# Patient Record
Sex: Female | Born: 1990 | Race: White | Hispanic: No | Marital: Married | Smoking: Former smoker
Health system: Southern US, Community
[De-identification: ages and names within clinical notes are randomized; demographics above are authoritative.]

## PROBLEM LIST (undated history)

## (undated) DIAGNOSIS — J45909 Unspecified asthma, uncomplicated: Secondary | ICD-10-CM

## (undated) DIAGNOSIS — F419 Anxiety disorder, unspecified: Secondary | ICD-10-CM

## (undated) DIAGNOSIS — R569 Unspecified convulsions: Secondary | ICD-10-CM

## (undated) DIAGNOSIS — F32A Depression, unspecified: Secondary | ICD-10-CM

## (undated) DIAGNOSIS — K449 Diaphragmatic hernia without obstruction or gangrene: Secondary | ICD-10-CM

## (undated) DIAGNOSIS — E538 Deficiency of other specified B group vitamins: Secondary | ICD-10-CM

## (undated) DIAGNOSIS — G8929 Other chronic pain: Secondary | ICD-10-CM

## (undated) DIAGNOSIS — F39 Unspecified mood [affective] disorder: Secondary | ICD-10-CM

## (undated) DIAGNOSIS — K219 Gastro-esophageal reflux disease without esophagitis: Secondary | ICD-10-CM

## (undated) DIAGNOSIS — K5792 Diverticulitis of intestine, part unspecified, without perforation or abscess without bleeding: Secondary | ICD-10-CM

## (undated) DIAGNOSIS — F329 Major depressive disorder, single episode, unspecified: Secondary | ICD-10-CM

## (undated) DIAGNOSIS — G43909 Migraine, unspecified, not intractable, without status migrainosus: Secondary | ICD-10-CM

## (undated) HISTORY — DX: Unspecified asthma, uncomplicated: J45.909

## (undated) HISTORY — PX: TUBAL LIGATION: SHX77

## (undated) HISTORY — DX: Major depressive disorder, single episode, unspecified: F32.9

## (undated) HISTORY — DX: Anxiety disorder, unspecified: F41.9

## (undated) HISTORY — DX: Depression, unspecified: F32.A

## (undated) HISTORY — DX: Deficiency of other specified B group vitamins: E53.8

## (undated) HISTORY — PX: CHOLECYSTECTOMY: SHX55

## (undated) HISTORY — PX: UPPER GASTROINTESTINAL ENDOSCOPY: SHX188

---
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
## 2012-03-01 ENCOUNTER — Ambulatory Visit: Payer: Self-pay | Admitting: Obstetrics & Gynecology

## 2012-03-04 ENCOUNTER — Observation Stay: Payer: Self-pay | Admitting: Obstetrics and Gynecology

## 2012-03-04 LAB — CBC WITH DIFFERENTIAL/PLATELET
Basophil #: 0.1 x10 3/mm 3
Basophil %: 0.6 %
Eosinophil #: 0.2 x10 3/mm 3
Eosinophil %: 2 %
HCT: 28.9 % — ABNORMAL LOW
HGB: 9.4 g/dL — ABNORMAL LOW
Lymphocyte %: 13.6 %
Lymphs Abs: 1.6 x10 3/mm 3
MCH: 26.4 pg
MCHC: 32.5 g/dL
MCV: 81 fL
Monocyte #: 0.9 "x10 3/mm "
Monocyte %: 7.7 %
Neutrophil #: 9 x10 3/mm 3 — ABNORMAL HIGH
Neutrophil %: 76.1 %
Platelet: 222 x10 3/mm 3
RBC: 3.56 X10 6/mm 3 — ABNORMAL LOW
RDW: 14.1 %
WBC: 11.8 x10 3/mm 3 — ABNORMAL HIGH

## 2012-03-04 LAB — URINALYSIS, COMPLETE
Bacteria: NONE SEEN
Bilirubin,UR: NEGATIVE
Glucose,UR: NEGATIVE mg/dL (ref 0–75)
Leukocyte Esterase: NEGATIVE
Nitrite: NEGATIVE
Protein: NEGATIVE
Specific Gravity: 1.005 (ref 1.003–1.030)
WBC UR: <1 /HPF (ref 0–5)

## 2012-03-04 LAB — BASIC METABOLIC PANEL WITH GFR
Anion Gap: 8
BUN: 3 mg/dL — ABNORMAL LOW
Calcium, Total: 8.4 mg/dL — ABNORMAL LOW
Chloride: 109 mmol/L — ABNORMAL HIGH
Co2: 23 mmol/L
Creatinine: 0.48 mg/dL — ABNORMAL LOW
EGFR (African American): >60
EGFR (Non-African Amer.): >60
Glucose: 78 mg/dL
Osmolality: 275
Potassium: 3.6 mmol/L
Sodium: 140 mmol/L

## 2012-03-13 ENCOUNTER — Ambulatory Visit: Payer: Self-pay | Admitting: Obstetrics & Gynecology

## 2012-03-21 ENCOUNTER — Encounter: Payer: Self-pay | Admitting: Obstetrics and Gynecology

## 2012-03-21 LAB — URINALYSIS, COMPLETE
Bacteria: NONE SEEN
Ketone: NEGATIVE
Nitrite: NEGATIVE
Ph: 7 (ref 4.5–8.0)
Protein: NEGATIVE
RBC,UR: <1 /HPF (ref 0–5)
WBC UR: 2 /HPF (ref 0–5)

## 2012-03-28 ENCOUNTER — Encounter: Payer: Self-pay | Admitting: Maternal and Fetal Medicine

## 2012-03-28 LAB — URINALYSIS, COMPLETE
Bilirubin,UR: NEGATIVE
Blood: NEGATIVE
Glucose,UR: 150 mg/dL (ref 0–75)
Ketone: NEGATIVE
Nitrite: NEGATIVE
Ph: 7 (ref 4.5–8.0)
Protein: NEGATIVE
RBC,UR: 1 /HPF (ref 0–5)
Specific Gravity: 1.009 (ref 1.003–1.030)
Squamous Epithelial: 37
WBC UR: 14 /HPF (ref 0–5)

## 2012-04-04 ENCOUNTER — Encounter: Payer: Self-pay | Admitting: Maternal & Fetal Medicine

## 2012-04-04 LAB — URINALYSIS, COMPLETE
Bilirubin,UR: NEGATIVE
Blood: NEGATIVE
Ketone: NEGATIVE
Ph: 7 (ref 4.5–8.0)
Protein: NEGATIVE
RBC,UR: 2 /HPF (ref 0–5)
Specific Gravity: 1.01 (ref 1.003–1.030)
Squamous Epithelial: 4
WBC UR: 2 /HPF (ref 0–5)

## 2012-04-11 ENCOUNTER — Encounter: Payer: Self-pay | Admitting: Obstetrics and Gynecology

## 2012-04-11 LAB — URINALYSIS, COMPLETE
Bilirubin,UR: NEGATIVE
Blood: NEGATIVE
Glucose,UR: 50 mg/dL (ref 0–75)
Ketone: NEGATIVE
Nitrite: NEGATIVE
Specific Gravity: 1.004 (ref 1.003–1.030)
Squamous Epithelial: 60

## 2012-04-15 ENCOUNTER — Inpatient Hospital Stay: Payer: Self-pay

## 2012-04-15 LAB — CBC WITH DIFFERENTIAL/PLATELET
Basophil #: 0.1 10*3/uL (ref 0.0–0.1)
Basophil %: 0.6 %
Eosinophil #: 0 10*3/uL (ref 0.0–0.7)
HCT: 28.1 % — ABNORMAL LOW (ref 35.0–47.0)
HGB: 9.1 g/dL — ABNORMAL LOW (ref 12.0–16.0)
Lymphocyte #: 1.8 10*3/uL (ref 1.0–3.6)
Lymphocyte %: 13.8 %
MCH: 23.6 pg — ABNORMAL LOW (ref 26.0–34.0)
MCHC: 32.5 g/dL (ref 32.0–36.0)
MCV: 73 fL — ABNORMAL LOW (ref 80–100)
Neutrophil #: 10.3 10*3/uL — ABNORMAL HIGH (ref 1.4–6.5)
RDW: 15.6 % — ABNORMAL HIGH (ref 11.5–14.5)

## 2012-04-17 LAB — HEMATOCRIT: HCT: 23 % — ABNORMAL LOW (ref 35.0–47.0)

## 2012-05-20 DIAGNOSIS — F329 Major depressive disorder, single episode, unspecified: Secondary | ICD-10-CM | POA: Insufficient documentation

## 2013-05-15 ENCOUNTER — Observation Stay: Payer: Self-pay | Admitting: Obstetrics and Gynecology

## 2013-05-19 ENCOUNTER — Inpatient Hospital Stay: Payer: Self-pay

## 2013-05-19 LAB — GC/CHLAMYDIA PROBE AMP

## 2013-05-19 LAB — DRUG SCREEN, URINE
Barbiturates, Ur Screen: NEGATIVE (ref ?–200)
Benzodiazepine, Ur Scrn: NEGATIVE (ref ?–200)
Cocaine Metabolite,Ur ~~LOC~~: NEGATIVE (ref ?–300)
MDMA (Ecstasy)Ur Screen: NEGATIVE (ref ?–500)
Phencyclidine (PCP) Ur S: NEGATIVE (ref ?–25)

## 2013-05-19 LAB — CBC WITH DIFFERENTIAL/PLATELET
Basophil %: 0.3 %
HGB: 9.5 g/dL — ABNORMAL LOW (ref 12.0–16.0)
Lymphocyte #: 1.8 10*3/uL (ref 1.0–3.6)
Lymphocyte %: 14.7 %
MCH: 23.9 pg — ABNORMAL LOW (ref 26.0–34.0)
Monocyte #: 0.9 x10 3/mm (ref 0.2–0.9)
Monocyte %: 7.9 %
WBC: 12 10*3/uL — ABNORMAL HIGH (ref 3.6–11.0)

## 2013-06-15 DIAGNOSIS — K589 Irritable bowel syndrome without diarrhea: Secondary | ICD-10-CM | POA: Insufficient documentation

## 2014-02-19 ENCOUNTER — Ambulatory Visit: Payer: Self-pay | Admitting: Family Medicine

## 2014-02-19 LAB — RAPID STREP-A WITH REFLX: Micro Text Report: NEGATIVE

## 2014-02-23 LAB — BETA STREP CULTURE(ARMC)

## 2014-08-23 DIAGNOSIS — E119 Type 2 diabetes mellitus without complications: Secondary | ICD-10-CM | POA: Insufficient documentation

## 2014-10-30 NOTE — Consult Note (Signed)
Referral Information:   Reason for Referral 24yo G2P1 at 2760w3d (by [id number] [facility] with  GDM presents for follow up.    Referring Physician [facility]    Prenatal Hx Patient had an abnormal glucola of 152 on 8/5 and a subsequent 3hr GTT that was abnormal on 8/8 (one month ago) with values of 96/207/151/132.  She has been to [facility] and has been checking her BS 4x daily.  She was started on glyburide. The dose was increased last weekd to 2.5 mg am/5 mg pm.    Past Obstetrical Hx 2009 NVD of 8#10oz female (different FOB)   Allergies:   Percocet 10/325: Rash  Chocolate: Rash  Other -Explain in Comment: Rash  Vital Signs/Notes:  Nursing Vital Signs: **Vital Signs.:   16-Sep-13 10:53   Vital Signs Type Routine   Temperature Temperature (F) 97.6   Celsius 36.4   Temperature Source oral   Pulse Pulse 114   Pulse source if not from Vital Sign Device dinamap   Systolic BP Systolic BP 149   Diastolic BP (mmHg) Diastolic BP (mmHg) 63   Mean BP 91   BP Source  if not from Vital Sign Device dinamap   Perinatal Consult:   LMP 21-Jul-2011    PMed Hx Rubella Immune, Hx of varicella    Past Medical History cont'd GERD exercise induced asthma    PSurg Hx Laparoscopic cholycystectomy    FHx PGF with diabetes and heart disease    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    Soc Hx Single, smokes 1/2 PPD x 2-3 years, no ETOH since pregnancy, denies drugs   Routine UA:  09-Sep-13 08:10    Color (UA) Yellow   Clarity (UA) Hazy   Glucose (UA) Negative   Bilirubin (UA) Negative   Ketones (UA) Negative   Specific Gravity (UA) 1.009   Blood (UA) Negative   pH (UA) 7.0   Protein (UA) Negative   Nitrite (UA) Negative   Leukocyte Esterase (UA) Negative (Result(s) reported on 21 Mar 2012 at 09:11AM.)   RBC (UA) <1 /HPF   WBC (UA) 2 /HPF   Bacteria (UA) NONE SEEN   Epithelial Cells (UA) 5 /HPF   Mucous (UA) PRESENT   Amorphous  Crystal (UA) PRESENT (Result(s) reported on 21 Mar 2012 at 09:11AM.)  16-Sep-13 10:58    Color (UA) Yellow   Clarity (UA) Cloudy   Glucose (UA) 150 mg/dL   Bilirubin (UA) Negative   Ketones (UA) Negative   Specific Gravity (UA) 1.009   Blood (UA) Negative   pH (UA) 7.0   Protein (UA) Negative   Nitrite (UA) Negative   Leukocyte Esterase (UA) Trace (Result(s) reported on 28 Mar 2012 at 11:20AM.)   RBC (UA) 1 /HPF   WBC (UA) 14 /HPF   Bacteria (UA) 2+   Epithelial Cells (UA) 37 /HPF   Mucous (UA) PRESENT (Result(s) reported on 28 Mar 2012 at 11:20AM.)   Impression/Recommendations:   Impression 24yo G2P1 at 5860w3d with GDM on glyburide, currently with most values out of range.    Recommendations 1.  See Dr. Reuel DerbyEllestad's consult of 03/21/2012 for complete details 2.  Recommend using sugar substitutes in tea and changing to diet soda and walking daily. 3.  Recommend increasing glyburide to 5 mg in the morning and 7.5 mg in the evening  4.  Continue 2x weekly NST/AFI. 5.  Follow up EFW scheduled for 2  weeks in  order to make delivery recommendations (ie amniocentesis for fetal lung maturity at 37 weeks or 39 weeks without amnio). 6.  Follow up 1wk with MFM to monitor BS.   Plan:   Genetic Counseling no    Antepartum Testing Twice weekly     Total Time Spent with Patient 15 minutes    >50% of visit spent in couseling/coordination of care yes    Office Use Only 99242  Level 2 ( ) NEW office consult exp prob focused, 99213  Office Visit Level 3 ( ) EST exp prob focused outpt   Coding Description: MATERNAL CONDITIONS/HISTORY INDICATION(S).   Diabetes - Gestational GDM.  Electronic Signatures: Marcelino Scot (MD)  (Signed 16-Sep-13 11:31)  Authored: Referral, Allergies, Vital Signs/Notes, Consult, Lab, Impression, Plan, Billing, Coding Description   Last Updated: 16-Sep-13 11:31 by Marcelino Scot (MD)

## 2014-10-30 NOTE — Consult Note (Signed)
Referral Information:   Reason for Referral 24yo G2P1 at 5110w3d [facility](EDC 10/25 by [id number] [facility]10/[facility]) with  GDM on glyburide  presents for follow up.    Referring Physician [facility]    Prenatal Hx Patient had an abnormal 3hr GTT  on glyburide  5 mg am/7.5 mg pm with inconsistent adherence to her diet /medication regimen. Ultrasound today reveals a LGA fetus 8lbs 7 oz AFI=19    Past Obstetrical Hx 2009 spontaneous vaginal delivery of 8#10oz female (different FOB)   Home Medications: Medication Instructions Status  Prilosec 40 mg oral delayed release capsule 1 cap(s) orally once a day Active  glyBURIDE 2.5 mg oral tablet 2 tab(s) orally once a day (in the morning) Active  glyBURIDE 7.5 milligram(s) orally once a day (at bedtime) Active   Allergies:   Percocet 10/325: Rash  Chocolate: Rash  Other -Explain in Comment: Rash  Vital Signs/Notes:  Nursing Vital Signs: **Vital Signs.:   30-Sep-13 11:19   Vital Signs Type Routine   Pulse Pulse 105   Pulse source if not from Vital Sign Device dinamap   Respirations Respirations 16   Systolic BP Systolic BP 134   Diastolic BP (mmHg) Diastolic BP (mmHg) 66   Mean BP 88   BP Source  if not from Vital Sign Device dinamap   Perinatal Consult:   LMP 21-Jul-2011    PMed Hx Rubella Immune, Hx of varicella    Past Medical History cont'd GERD exercise induced asthma    PSurg Hx Laparoscopic cholycystectomy    FHx PGF with diabetes and heart disease    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)     Additional Lab/Radiology Notes 9/23-9/30 FBS 55-143 (2/8 >100) 2h pp breakfast 102-176 (4/6 >120) 2h pp lunch 89-201 (4/7 >120) 2 h pp supper 108-220 (4/6 >120)  pt's highest blood sugars after eating at [place] out.   Impression/Recommendations:   Impression 24yo G2P1 at 4910w3d with GDM on glyburide 5 /7.5 with erratic blood sugars between 55 -231 on medication. Over past 2-3 days  having more low/normal blood sugars even when holding her glyburide. It is challenging to sort out her diet (when and what she eats - sometimes skips meal then eats at fast food including sweet tea and soda) and medication adherence (Saturday  I didn't take the glyuride that night to see what would happen).  LGA fetus >97th % EFW with high normal fluid BPP 8/8 on ultrasound I reviewed risk of shoulder dystocia, IUFD, respiratory issues, hypoglycemia and hyperbilirubinemia associated with GDM. I told the pt with EFW >4.5 kg I offer primary cesarean to avoid shoulder dystocia- given her pelvis is proven  to 8lb 10 oz - attempt at spontaneous vaginal delivery seems reasonable.  While her recently decreasing blood sugars on her same regimen are somewhat concerning, NST-AFIs  are generally considered better predictors of placental function/fetal health .  I reviewed risk of Type 2 DM in women who have had GDM and need for postnatal medical follow up.    Recommendations 1- Take medication and  encourage dietary consistency with 3 meals and 3 snacks - check sugar for symptoms of lows contact [facility] and drop glyburide to 2.5/5 if lows persist. 2 -I advised her to report decreased fetal movement to CNM or MD on call 3- Given evidence of "diabetic fetopathy"I discussed Amniocentesis and induction at 37 weeks. I  contacted Wells Guiles CNM - she will discuss with her physician back up regarding delivery planning.   Plan:    Antepartum Testing Twice weekly    Delivery Mode Vaginal    Delivery at what gestational age 42 with amnio for lung maturity     Total Time Spent with Patient 15 minutes    >50% of visit spent in couseling/coordination of care yes    Office Use Only 99213  Office Visit Level 3 ( ) EST exp prob focused outpt   Coding Description: MATERNAL CONDITIONS/HISTORY INDICATION(S).   Diabetes - Gestational GDM.  Electronic Signatures: Rondall Allegra (MD)  (Signed 30-Sep-13 13:43)  Authored: Referral, Home Medications, Allergies, Vital Signs/Notes, Consult, Lab, Lab/Radiology Notes, Impression, Plan, Billing, Coding Description   Last Updated: 30-Sep-13 13:43 by Rondall Allegra (MD)

## 2014-10-30 NOTE — Consult Note (Signed)
Referral Information:   Reason for Referral 24yo G2P1 at [redacted]w[redacted]d (by [redacted]w[redacted]d [place] on 09/10/2011 at [facility]) with  GDM presents for follow up.  She ran out of glyburide, so did not take from 9/16-9/20.    Referring Physician [facility]    Prenatal Hx Patient had an abnormal glucola of 152 on 8/5 and a subsequent 3hr GTT that was abnormal on 8/8 (one month ago) with values of 96/207/151/132.  She has been to [facility] and has been checking her BS 4x daily.  She was started on glyburide. The dose was increased last week to 5 mg am/7.5 mg pm.  As noted, she did not take any glyburide from 9/16-9/20.    Past Obstetrical Hx 2009 NVD of 8#10oz female (different FOB)   Home Medications: Medication Instructions Status  Prilosec 40 mg oral delayed release capsule 1 cap(s) orally once a day Active  glyBURIDE 2.5 mg oral tablet 2 tab(s) orally once a day (in the morning) Active  glyBURIDE 7.5 milligram(s) orally once a day (at bedtime) Active   Allergies:   Percocet 10/325: Rash  Chocolate: Rash  Other -Explain in Comment: Rash  Perinatal Consult:   LMP 21-Jul-2011    PMed Hx Rubella Immune, Hx of varicella    Past Medical History cont'd GERD exercise induced asthma    PSurg Hx Laparoscopic cholycystectomy    FHx PGF with diabetes and heart disease    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    Soc Hx Single, smokes 1/2 PPD x 2-3 years, no ETOH since pregnancy, denies drugs   Routine UA:  09-Sep-13 08:10    Color (UA) Yellow   Clarity (UA) Hazy   Glucose (UA) Negative   Bilirubin (UA) Negative   Ketones (UA) Negative   Specific Gravity (UA) 1.009   Blood (UA) Negative   pH (UA) 7.0   Protein (UA) Negative   Nitrite (UA) Negative   Leukocyte Esterase (UA) Negative (Result(s) reported on 21 Mar 2012 at 09:11AM.)   RBC (UA) <1 /HPF   WBC (UA) 2 /HPF   Bacteria (UA) NONE SEEN   Epithelial Cells (UA) 5 /HPF   Mucous (UA) PRESENT   Amorphous Crystal  (UA) PRESENT (Result(s) reported on 21 Mar 2012 at 09:11AM.)  16-Sep-13 10:58    Color (UA) Yellow   Clarity (UA) Cloudy   Glucose (UA) 150 mg/dL   Bilirubin (UA) Negative   Ketones (UA) Negative   Specific Gravity (UA) 1.009   Blood (UA) Negative   pH (UA) 7.0   Protein (UA) Negative   Nitrite (UA) Negative   Leukocyte Esterase (UA) Trace (Result(s) reported on 28 Mar 2012 at 11:20AM.)   RBC (UA) 1 /HPF   WBC (UA) 14 /HPF   Bacteria (UA) 2+   Epithelial Cells (UA) 37 /HPF   Mucous (UA) PRESENT (Result(s) reported on 28 Mar 2012 at 11:20AM.)  23-Sep-13 08:02    Color (UA) Yellow   Clarity (UA) Cloudy   Glucose (UA) >=500   Bilirubin (UA) Negative   Ketones (UA) Negative   Specific Gravity (UA) 1.010   Blood (UA) Negative   pH (UA) 7.0   Protein (UA) Negative   Nitrite (UA) Negative   Leukocyte Esterase (UA) Negative (Result(s) reported on 04 Apr 2012 at 12:29PM.)   RBC (UA) 2 /HPF   WBC (UA) 2 /HPF   Bacteria (UA) NONE SEEN   Epithelial Cells (UA) 4 /HPF   Mucous (UA) PRESENT  Calcium Oxalate Crystal (UA) PRESENT (Result(s) reported on 04 Apr 2012 at 12:29PM.)     Additional Lab/Radiology Notes 9/16-9/20 (no glyburide) fastings all over 100 (highest 143) and postprandials all out of range 160s-180 9/20-23 fastings all 70s only 5 post meal accuchecks recorded (102-165) 102, 142, 163, 165, 154   Impression/Recommendations:   Impression 24yo G2P1 at 2015w3d with GDM on glyburide, currently with limited accuchecks for the last 3 days but improvement in fasting blood glucose values while taking glyburide.  Limited number of post meal accuchecks available for adjustment.    Recommendations 1.  See Dr. Reuel DerbyEllestad's consult of 03/21/2012 for complete details 2.  She continues to eat fast food.  Agree with Dr. Megan MansBrancazios recomemndations to encourage using sugar substitutes in tea, changing to diet soda and walking daily. 3.  Continue glyburide to 5 mg in the morning and 7.5 mg in  the evening  4.  Continue 2x weekly NST/AFI. 5.  Follow up EFW and MFM consult next  week in order to make delivery recommendations (ie amniocentesis for fetal lung maturity at 37 weeks or 39 weeks without amnio). Patient aware of possible delivery recommendations if fetopathy present.  She reports elevated amniotic fluid volumes and understand this finding is associated with diabetic fetopathy.   Plan:   Genetic Counseling no    Antepartum Testing Twice weekly     Total Time Spent with Patient 15 minutes    >50% of visit spent in couseling/coordination of care yes    Office Use Only 99213  Office Visit Level 3 (15min) EST exp prob focused outpt   Coding Description: MATERNAL CONDITIONS/HISTORY INDICATION(S).  Electronic Signatures: Consuelo PandySmall, Makiyah Zentz (MD)  (Signed 23-Sep-13 13:37)  Authored: Referral, Home Medications, Allergies, Consult, Lab, Lab/Radiology Notes, Impression, Plan, Billing, Coding Description   Last Updated: 23-Sep-13 13:37 by Consuelo PandySmall, Keena Dinse (MD)

## 2014-10-30 NOTE — Consult Note (Signed)
Referral Information:   Reason for Referral 24yo G2P1 at [redacted]w[redacted]d(by [id number] [facility] for [facility] recommendations.    [facility]    Prenatal Hx Patient had an abnormal glucola of 152 on 8/5 and a subsequent 3hr GTT that was abnormal on 8/8 (one month ago) with values of 96/207/151/132.  She has been to [facility] and has been checking her BS 4x daily.  She was recently started on glyburide (current dose 2.31m11mm/2.31mg81m on 8/29) due to persistently elevated fasting blood sugars and 2hr post prandials.  She states that she follows the nutritional guidelines but admits to drinking sweet tea and soda every day.    Past Obstetrical Hx 2009 NVD of 8#10oz female (different FOB)   Home Medications: Medication Instructions Status  glyBURIDE 2.5 mg oral tablet 1 tab(s) orally 2 times a day Active   Allergies:   Percocet 10/325: Rash  Chocolate: Rash  Other -Explain in Comment: Rash  Vital Signs/Notes:  Nursing Vital Signs: **Vital Signs.:   09-Sep-13 09:00   Vital Signs Type Routine   Temperature Temperature (F) 98   Celsius 36.6   Temperature Source oral   Pulse Pulse 103   Pulse source if not from Vital Sign Device dinamap   Respirations Respirations 16   Systolic BP Systolic BP 115 474iastolic BP (mmHg) Diastolic BP (mmHg) 56   Mean BP 75   BP Source  if not from Vital Sign Device dinamap   Perinatal Consult:   LMP 21-Jul-2011    PMed Hx Rubella Immune, Hx of varicella    Past Medical History cont'd GERD exercise induced asthma    PSurg Hx Laparoscopic cholycystectomy    FHx PGF with diabetes and heart disease    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    Soc Hx Single, smokes 1/2 PPD x 2-3 years, no ETOH since pregnancy, denies drugs   Review Of Systems:   Fever/Chills No     Cough No     Abdominal Pain No     Diarrhea No     Constipation No      Nausea/Vomiting No     SOB/DOE No     Chest Pain No     Dysuria No     Tolerating Diet Yes     Medications/Allergies Reviewed Medications/Allergies reviewed      Routine UA:  09-Sep-13 08:10    Color (UA) Yellow   Clarity (UA) Hazy   Glucose (UA) Negative   Bilirubin (UA) Negative   Ketones (UA) Negative   Specific Gravity (UA) 1.009   Blood (UA) Negative   pH (UA) 7.0   Protein (UA) Negative   Nitrite (UA) Negative   Leukocyte Esterase (UA) Negative (Result(s) reported on 21 Mar 2012 at 09:11AM.)   RBC (UA) <1 /HPF   WBC (UA) 2 /HPF   Bacteria (UA) NONE SEEN   Epithelial Cells (UA) 5 /HPF   Mucous (UA) PRESENT   Amorphous Crystal (UA) PRESENT (Result(s) reported on 21 Mar 2012 at 09:11AM.)     Additional Lab/Radiology Notes Since 8/29... FBS 90-132 (10/11 out of range) 2hr PP breakfast 104-179 (7/9 out of range) 2hr PP lunch 118-190 (8/10 out of range) 2hr PP dinner 102-175 (8/9 out of range)   Impression/Recommendations:   Impression 24yo G2P1 at 33w3110w3d GDM on glyburide, currently with most values out of range.  No change  in BS since starting glyburide, likely due to drinking sweet tea and soda.  EFW today 95th% with the [facility] >95th percentile.    Recommendations 1.  Has met with [facility]. 2.  Recommend using sugar substitutes in tea and changing to diet soda and walking daily. 3.  Recommend increasing glyburide to 40m in the pm and monitoring 2hr post prandials using a food diary. 4.  Continue 2x weekly NST/AFI. 5.  Consider f/up EFW in 3-4 weeks in order to make delivery recommendations (ie amniocentesis for fetal lung maturity at 37 weeks or 39 weeks without amnio). 6.  f/up 1wk with MFM to monitor BS and dietary changes.   Plan:   Genetic Counseling no    Antepartum Testing Twice weekly      Total Time Spent with Patient 30 minutes    >50% of visit spent in couseling/coordination of care yes    Office Use Only 99242  Level 2 (347m) NEW office  consult exp prob focused   Coding Description: MATERNAL CONDITIONS/HISTORY INDICATION(S).   Diabetes - Gestational GDM.  Electronic Signatures: ElWynona NeatMD)  (Signed 09-Sep-13 10:52)  Authored: Referral, Home Medications, Allergies, Vital Signs/Notes, Consult, Exam, Lab, Lab/Radiology Notes, Impression, Plan, Billing, Coding Description   Last Updated: 09-Sep-13 10:52 by ElWynona NeatMD)

## 2014-11-20 NOTE — H&P (Signed)
L&D Evaluation:  History Expanded:  HPI 24 yo G3P2002 at 6860w4d gestational age by LMP.  Her pregnancy has been complicated by uncontrolled gestational diabetes on glyburide 10mg  twice daily.  She presents for Induction of labor for the above.  She notes positive fetal movement, no leakage of fluid, and no vaginal bleeding.  Her chart indicates that there is suspected drug abuse by the patient.   Gravida 3   Term 2   PreTerm 0   Abortion 0   Living 2   Blood Type (Maternal) O positive   Group B Strep Results Maternal (Result >5wks must be treated as unknown) negative   Maternal HIV Negative   Maternal Syphilis Ab Nonreactive   Maternal Varicella Immune   Rubella Results (Maternal) immune   Maternal T-Dap Unknown   Patient's Medical History GERD   Patient's Surgical History laparoscopic cholecystectomy   Medications Pre Natal Vitamins  glyburide 10 mg po BID   Allergies NKDA   Social History question of drug abuse   ROS:  ROS All systems were reviewed.  HEENT, CNS, GI, GU, Respiratory, CV, Renal and Musculoskeletal systems were found to be normal.   Exam:  Vital Signs T 98.5, P107, BP 126/72, RR 18   General no apparent distress   Mental Status clear   Chest clear   Heart tachycardic, rhythm regular   Abdomen gravid, non-tender   Estimated Fetal Weight 7 pounds   Fetal Position cephalic   Back no CVAT   Edema no edema   Pelvic no external lesions, 2/50/-2   Mebranes Intact   FHT normal rate with no decels   FHT Description 140/mod var/+accels/no decels   Ucx irregular   Impression:  Impression Induction of labor for uncontrolled gestational diabetes mellitus   Plan:  Comments Foley catheter placed for cervical ripening - get urine drug screen for suspected drug use - check BG every 2 hours and every 1 hour while in labor. - gbs neg - expect vaginal delivery   Labs:  Lab Results: Routine Micro:  07-Nov-14 08:47   Micro Text  Report CHLAM/N.GC RT-PCR ([facility])   CHLAMYDIA                 CHLAMYDIA TRACHOMATIS NEGATIVE   N.GONORRHOEAE             N.GONORRHOEAE NEGATIVE   ANTIBIOTIC                       Routine Hem:  07-Nov-14 08:47   WBC (CBC)  12.0  RBC (CBC) 3.97  Hemoglobin (CBC)  9.5  Hematocrit (CBC)  28.5  Platelet Count (CBC) 278  MCV  72  MCH  23.9  MCHC 33.4  RDW  15.7  Neutrophil % 76.0  Lymphocyte % 14.7  Monocyte % 7.9  Eosinophil % 1.1  Basophil % 0.3  Neutrophil #  9.1  Lymphocyte # 1.8  Monocyte # 0.9  Eosinophil # 0.1  Basophil # 0.0 (Result(s) reported on 19 May 2013 at 09:47AM.)   Electronic Signatures: Conard NovakJackson, Laconda Basich D (MD)  (Signed [phone]-Nov-14 15:13)  Authored: L&D Evaluation, Labs   Last Updated: 07-Nov-14 15:13 by Conard NovakJackson, Shivank Pinedo D (MD)

## 2014-11-20 NOTE — H&P (Signed)
L&D Evaluation:  History:   HPI 24 yo G2P1001 at 5310w2d gestational age by 20 week ultrasound who's pregnancy has been complicated by a new diagnosis of gestational diabetes.  She just recently received her blood glucose testing equipment and noted this morning that she had a 280mg /dL reading. She called the office and was instructed to come to be assessed at L&D.  She notes positive fetal movement, denies VB and leakage of fluid.  She also denies contractions.  She denies fevers, chills, cough, GI and urinary symptoms.    Patient's Medical History GERD    Patient's Surgical History laparoscopic cholecystectomy    Medications Pre [place]atal Vitamins  zantac    Allergies percocet    Social History none    Family History Non-Contributory   ROS:   ROS All systems were reviewed.  HEENT, CNS, GI, GU, Respiratory, CV, Renal and Musculoskeletal systems were found to be normal.   Exam:   Vital Signs stable    Urine Protein negative dipstick    General no apparent distress    Mental Status clear    Chest clear    Heart normal sinus rhythm    Abdomen gravid, non-tender    Estimated Fetal Weight Average for gestational age    Back no CVAT    Edema no edema    Reflexes 2+    Mebranes Intact    FHT normal rate with no decels    FHT Description 135/mod var/+accels/no decels    Ucx absent    Skin no lesions    Other BMP: Gluc=78, Cr=0.48, Anion Gap=8C CBC: WBC=11.8, Hct=28.9, Plt=222 U/A: Gluc=neg, Nitr=neg, LE=neg, Spec grav=1.005, WBC <1/hpf, Epis=<1/hpf, bacteria=none seen.   Impression:   Impression reactive NST, gestational diabetes, no evidence of hyperglycemia   Plan:   Plan UA, EFM/NST    Comments -will discharge patient with instructions to return if BG elevates and remains elevated.  Difficult to explain fasting BG=280 and post-prandial in normal range in her setting.  There is the possibility of error in taking the sample.   No evidence of infection at this  time.   Fetal tracing reassuring given gestational age. She has follow up next Thursday with ultrasound.  Encouraged her to keep her BG log carefully.    Follow Up Appointment already scheduled   Electronic Signatures: Conard NovakJackson, Kuper Rennels D (MD)  (Signed 23-Aug-13 14:51)  Authored: L&D Evaluation   Last Updated: 23-Aug-13 14:51 by Conard NovakJackson, Khaleed Holan D (MD)

## 2014-11-20 NOTE — H&P (Signed)
L&D Evaluation:  History:   HPI 24 yo G2P1001 at 8086w2d gestational age by a 6 week ultrasound.  Her pregnancy has been complicated by uncontrolled gestational diabetes, as well as chlamydia for which she has had a negative test of cure.  For her diabetes she has been taking glyburide 5mg  in the AM and 7.5mg  in the PM.  Her GDM has been managed by [facility] with reports of a large range of values.  She was evaluated by [facility] yesterday (10/3) and it was recommended that she undergo amniocentesis and induction, if fetal lungs mature.  Other concerns for her have been fetal macrosomia and she has been offered primary cesarean section.  She presented today for amniocentesis.  However, she was found to be contracting and dilated to 4cm.  She was, therefore admitted for labor.  She notes positive fetal movement, denies VB and leakage of fluid.  O+, RI, VZI, HBsAg neg, GBS negative on 04/04/12.    Patient's Medical History GERD    Patient's Surgical History Laparoscopic cholecystectomy    Medications Glyburide 5mg  in AM, 7.5mg  in PM.  Protonix 40mg  QAM    Allergies NKDA    Social History Former tobacco user    Family History Non-Contributory   ROS:   ROS All systems were reviewed.  HEENT, CNS, GI, GU, Respiratory, CV, Renal and Musculoskeletal systems were found to be normal., unless noted in hPI.   Exam:   General no apparent distress    Mental Status clear    Chest clear    Heart normal sinus rhythm    Abdomen gravid, tender with contractions    Estimated Fetal Weight Average for gestational age, macrosomic    Back no CVAT    Edema no edema    Pelvic 4/50/hi (ballotable)    Mebranes Intact    FHT normal rate with no decels    Ucx regular   Impression:   Impression active labor, uncontrolled GDM on glyburide, macrosomic fetus   Plan:   Plan EFM/NST, monitor contractions and for cervical change    Comments admit for labor, augment in necessary IVF, clear liquids for  GDM, Q1 BG checks, insulin per unit protocol if BGs elevated. CBC, T&S initially manage expectantly. NOTE: This is a late entry H&P.  She was admitted this morning.   Electronic Signatures: Conard NovakJackson, Justus Duerr D (MD)  (Signed 04-Oct-13 19:13)  Authored: L&D Evaluation   Last Updated: 04-Oct-13 19:13 by Conard NovakJackson, Aylene Acoff D (MD)

## 2015-04-03 ENCOUNTER — Ambulatory Visit: Admission: EM | Admit: 2015-04-03 | Discharge: 2015-04-03 | Discharge status: Absent without leave | Payer: Self-pay

## 2015-11-07 DIAGNOSIS — E538 Deficiency of other specified B group vitamins: Secondary | ICD-10-CM | POA: Insufficient documentation

## 2015-12-03 ENCOUNTER — Ambulatory Visit (HOSPITAL_BASED_OUTPATIENT_CLINIC_OR_DEPARTMENT_OTHER): Payer: Medicaid Other | Admitting: Pain Medicine

## 2015-12-03 ENCOUNTER — Other Ambulatory Visit
Admission: RE | Admit: 2015-12-03 | Discharge: 2015-12-03 | Disposition: A | Discharge status: Home / Self Care | Payer: Medicaid Other | Source: Ambulatory Visit | Attending: Pain Medicine | Admitting: Pain Medicine

## 2015-12-03 ENCOUNTER — Encounter: Payer: Self-pay | Admitting: Pain Medicine

## 2015-12-03 VITALS — BP 128/68 | HR 66 | Temp 98.4°F | Resp 16 | Ht 63.0 in | Wt 185.0 lb

## 2015-12-03 DIAGNOSIS — Z79899 Other long term (current) drug therapy: Secondary | ICD-10-CM | POA: Diagnosis not present

## 2015-12-03 DIAGNOSIS — F329 Major depressive disorder, single episode, unspecified: Secondary | ICD-10-CM

## 2015-12-03 DIAGNOSIS — G4486 Cervicogenic headache: Secondary | ICD-10-CM | POA: Insufficient documentation

## 2015-12-03 DIAGNOSIS — M549 Dorsalgia, unspecified: Secondary | ICD-10-CM

## 2015-12-03 DIAGNOSIS — M25512 Pain in left shoulder: Secondary | ICD-10-CM

## 2015-12-03 DIAGNOSIS — K219 Gastro-esophageal reflux disease without esophagitis: Secondary | ICD-10-CM | POA: Insufficient documentation

## 2015-12-03 DIAGNOSIS — S134XXS Sprain of ligaments of cervical spine, sequela: Secondary | ICD-10-CM

## 2015-12-03 DIAGNOSIS — Z0189 Encounter for other specified special examinations: Secondary | ICD-10-CM

## 2015-12-03 DIAGNOSIS — G8929 Other chronic pain: Secondary | ICD-10-CM | POA: Insufficient documentation

## 2015-12-03 DIAGNOSIS — M545 Low back pain, unspecified: Secondary | ICD-10-CM

## 2015-12-03 DIAGNOSIS — R51 Headache: Secondary | ICD-10-CM

## 2015-12-03 DIAGNOSIS — Z79891 Long term (current) use of opiate analgesic: Secondary | ICD-10-CM | POA: Insufficient documentation

## 2015-12-03 DIAGNOSIS — F419 Anxiety disorder, unspecified: Secondary | ICD-10-CM

## 2015-12-03 DIAGNOSIS — M546 Pain in thoracic spine: Secondary | ICD-10-CM

## 2015-12-03 DIAGNOSIS — M5442 Lumbago with sciatica, left side: Secondary | ICD-10-CM

## 2015-12-03 DIAGNOSIS — M5441 Lumbago with sciatica, right side: Secondary | ICD-10-CM

## 2015-12-03 DIAGNOSIS — M792 Neuralgia and neuritis, unspecified: Secondary | ICD-10-CM | POA: Insufficient documentation

## 2015-12-03 DIAGNOSIS — M791 Myalgia: Secondary | ICD-10-CM

## 2015-12-03 DIAGNOSIS — M5481 Occipital neuralgia: Secondary | ICD-10-CM | POA: Insufficient documentation

## 2015-12-03 DIAGNOSIS — E538 Deficiency of other specified B group vitamins: Secondary | ICD-10-CM

## 2015-12-03 DIAGNOSIS — E119 Type 2 diabetes mellitus without complications: Secondary | ICD-10-CM

## 2015-12-03 DIAGNOSIS — F1190 Opioid use, unspecified, uncomplicated: Secondary | ICD-10-CM | POA: Insufficient documentation

## 2015-12-03 DIAGNOSIS — M25511 Pain in right shoulder: Secondary | ICD-10-CM

## 2015-12-03 DIAGNOSIS — Z5181 Encounter for therapeutic drug level monitoring: Secondary | ICD-10-CM | POA: Insufficient documentation

## 2015-12-03 DIAGNOSIS — M7918 Myalgia, other site: Secondary | ICD-10-CM | POA: Insufficient documentation

## 2015-12-03 DIAGNOSIS — F418 Other specified anxiety disorders: Secondary | ICD-10-CM

## 2015-12-03 DIAGNOSIS — M542 Cervicalgia: Secondary | ICD-10-CM

## 2015-12-03 DIAGNOSIS — F32A Depression, unspecified: Secondary | ICD-10-CM

## 2015-12-03 LAB — COMPREHENSIVE METABOLIC PANEL
ALBUMIN: 4.2 g/dL (ref 3.5–5.0)
ALK PHOS: 57 U/L (ref 38–126)
ALT: 14 U/L (ref 14–54)
AST: 13 U/L — AB (ref 15–41)
Anion gap: 7 (ref 5–15)
BUN: 9 mg/dL (ref 6–20)
CALCIUM: 8.9 mg/dL (ref 8.9–10.3)
CO2: 27 mmol/L (ref 22–32)
CREATININE: 0.68 mg/dL (ref 0.44–1.00)
Chloride: 104 mmol/L (ref 101–111)
GFR calc non Af Amer: >60 mL/min (ref >60)
GLUCOSE: 105 mg/dL — AB (ref 65–99)
Potassium: 3.7 mmol/L (ref 3.5–5.1)
SODIUM: 138 mmol/L (ref 135–145)
Total Bilirubin: 0.8 mg/dL (ref 0.3–1.2)
Total Protein: 7 g/dL (ref 6.5–8.1)

## 2015-12-03 LAB — SEDIMENTATION RATE: Sed Rate: 4 mm/hr (ref 0–20)

## 2015-12-03 LAB — MAGNESIUM: Magnesium: 2 mg/dL (ref 1.7–2.4)

## 2015-12-03 LAB — C-REACTIVE PROTEIN: CRP: 1.1 mg/dL — ABNORMAL HIGH (ref <1.0)

## 2015-12-03 LAB — VITAMIN B12: VITAMIN B 12: 138 pg/mL — AB (ref 180–914)

## 2015-12-03 NOTE — Patient Instructions (Addendum)
Smoking Cessation, Tips for Success If you are ready to quit smoking, congratulations! You have chosen to help yourself be healthier. Cigarettes bring nicotine, tar, carbon monoxide, and other irritants into your body. Your lungs, heart, and blood vessels will be able to work better without these poisons. There are many different ways to quit smoking. Nicotine gum, nicotine patches, a nicotine inhaler, or nicotine nasal spray can help with physical craving. Hypnosis, support groups, and medicines help break the habit of smoking. WHAT THINGS CAN I DO TO MAKE QUITTING EASIER?  Here are some tips to help you quit for good:  Pick a date when you will quit smoking completely. Tell all of your friends and family about your plan to quit on that date.  Do not try to slowly cut down on the number of cigarettes you are smoking. Pick a quit date and quit smoking completely starting on that day.  Throw away all cigarettes.   Clean and remove all ashtrays from your home, work, and car.  On a card, write down your reasons for quitting. Carry the card with you and read it when you get the urge to smoke.  Cleanse your body of nicotine. Drink enough water and fluids to keep your urine clear or pale yellow. Do this after quitting to flush the nicotine from your body.  Learn to predict your moods. Do not let a bad situation be your excuse to have a cigarette. Some situations in your life might tempt you into wanting a cigarette.  Never have "just one" cigarette. It leads to wanting another and another. Remind yourself of your decision to quit.  Change habits associated with smoking. If you smoked while driving or when feeling stressed, try other activities to replace smoking. Stand up when drinking your coffee. Brush your teeth after eating. Sit in a different chair when you read the paper. Avoid alcohol while trying to quit, and try to drink fewer caffeinated beverages. Alcohol and caffeine may urge you to  smoke.  Avoid foods and drinks that can trigger a desire to smoke, such as sugary or spicy foods and alcohol.  Ask people who smoke not to smoke around you.  Have something planned to do right after eating or having a cup of coffee. For example, plan to take a walk or exercise.  Try a relaxation exercise to calm you down and decrease your stress. Remember, you may be tense and nervous for the first 2 weeks after you quit, but this will pass.  Find new activities to keep your hands busy. Play with a pen, coin, or rubber band. Doodle or draw things on paper.  Brush your teeth right after eating. This will help cut down on the craving for the taste of tobacco after meals. You can also try mouthwash.   Use oral substitutes in place of cigarettes. Try using lemon drops, carrots, cinnamon sticks, or chewing gum. Keep them handy so they are available when you have the urge to smoke.  When you have the urge to smoke, try deep breathing.  Designate your home as a nonsmoking area.  If you are a heavy smoker, ask your health care provider about a prescription for nicotine chewing gum. It can ease your withdrawal from nicotine.  Reward yourself. Set aside the cigarette money you save and buy yourself something nice.  Look for support from others. Join a support group or smoking cessation program. Ask someone at home or at work to help you with your plan   to quit smoking.  Always ask yourself, "Do I need this cigarette or is this just a reflex?" Tell yourself, "Today, I choose not to smoke," or "I do not want to smoke." You are reminding yourself of your decision to quit.  Do not replace cigarette smoking with electronic cigarettes (commonly called e-cigarettes). The safety of e-cigarettes is unknown, and some may contain harmful chemicals.  If you relapse, do not give up! Plan ahead and think about what you will do the next time you get the urge to smoke. HOW WILL I FEEL WHEN I QUIT SMOKING? You  may have symptoms of withdrawal because your body is used to nicotine (the addictive substance in cigarettes). You may crave cigarettes, be irritable, feel very hungry, cough often, get headaches, or have difficulty concentrating. The withdrawal symptoms are only temporary. They are strongest when you first quit but will go away within 10-14 days. When withdrawal symptoms occur, stay in control. Think about your reasons for quitting. Remind yourself that these are signs that your body is healing and getting used to being without cigarettes. Remember that withdrawal symptoms are easier to treat than the major diseases that smoking can cause.  Even after the withdrawal is over, expect periodic urges to smoke. However, these cravings are generally short lived and will go away whether you smoke or not. Do not smoke! WHAT RESOURCES ARE AVAILABLE TO HELP ME QUIT SMOKING? Your health care provider can direct you to community resources or hospitals for support, which may include:  Group support.  Education.  Hypnosis.  Therapy.   This information is not intended to replace advice given to you by your health care provider. Make sure you discuss any questions you have with your health care provider.   Document Released: 03/27/2004 Document Revised: 07/20/2014 Document Reviewed: 12/15/2012 [employer] Interactive Patient Education 2016 [employer] Instructed to get [facility] drawn today [facility] the [facility].

## 2015-12-03 NOTE — Progress Notes (Signed)
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 you have with your health care provider.   Document Released: 03/27/2004 Document Revised: 07/20/2014 Document Reviewed: 12/15/2012 Elsevier Interactive Patient Education 2016 [employer]. Instructed to get labwork drawn today in the [facility]

## 2015-12-03 NOTE — Progress Notes (Signed)
Safety precautions to be maintained throughout the outpatient stay will include: orient to surroundings, keep bed in low position, maintain call bell within reach at all times, provide assistance with transfer out of bed and ambulation.  

## 2015-12-04 ENCOUNTER — Ambulatory Visit: Payer: Medicaid Other | Admitting: Pain Medicine

## 2015-12-04 NOTE — Progress Notes (Signed)
Quick Note:   Normal fasting (NPO x 8 hours) glucose levels are between 65-99 mg/dl, with 2 hour fasting, levels are usually less than 140 mg/dl. Any random blood glucose level greater than 200 mg/dl is considered to be Diabetes.  Low levels of AST in the blood are expected and may be normal.  AST levels are often compared with results of other tests such as alkaline phosphatase (ALP), total protein, and bilirubin to help determine which form of liver disease is present.  AST is often measured to monitor treatment of persons with liver disease and may be ordered either by itself or along with other tests for this purpose.  Sometimes AST may be used to monitor people who are taking medications that are potentially toxic to the liver. If AST levels increase, then the person may be switched to another medication.  AST is compared directly to ALT and an AST/ALT ratio is calculated. This ratio may be used to distinguish between different causes of liver damage and to distinguish liver injury from damage to heart or muscle.  ______

## 2015-12-04 NOTE — Progress Notes (Signed)
Quick Note:   Normal Vitamin B-12 levels are between 180 and 914 pg/mL, for our Lab. Patients with levels below 180 pg/ml are considered to have a Vitamin B-12 "deficiency". Older patients with levels between 200 and 500 may be symptomatic, in which case it is considered an "insufficiency". Symptoms of deficiency or insufficiency may include: tingling and numbness of the digits (fingers & toes), generalized muscle weakness, staggering, irritability, confusion, forgetfulness, tenderness, fatigue, shortness of breath, palpitation, anemia, sporadic episodes of diarrhea, decreased immune system, cognitive impairment, and degeneration of the posterior sensory columns of the spinal cord. Deficiency can lead to anemia and congestive heart failure. Lack of vitamin B12 may lead to peripheral neuropathy. The recommended over-the-counter Vitamin B12 dose intake for deficiency is 125 to 2,000 micrograms of cyanocobalamin taken by mouth, daily.  ______ 

## 2015-12-04 NOTE — Progress Notes (Signed)

## 2015-12-06 LAB — 25-HYDROXY VITAMIN D LCMS D2+D3
25-Hydroxy, Vitamin D-2: <1 ng/mL
25-Hydroxy, Vitamin D-3: 34 ng/mL
25-Hydroxy, Vitamin D: 35 ng/mL

## 2015-12-12 ENCOUNTER — Other Ambulatory Visit: Payer: Self-pay

## 2015-12-12 LAB — COMPLIANCE DRUG ANALYSIS, UR

## 2016-01-15 ENCOUNTER — Ambulatory Visit: Payer: Medicaid Other | Attending: Pain Medicine | Admitting: Pain Medicine

## 2016-01-15 ENCOUNTER — Encounter: Payer: Self-pay | Admitting: Pain Medicine

## 2016-01-15 VITALS — BP 111/50 | HR 60 | Temp 98.1°F | Resp 16 | Ht 63.0 in | Wt 190.0 lb

## 2016-01-15 DIAGNOSIS — M25511 Pain in right shoulder: Secondary | ICD-10-CM | POA: Insufficient documentation

## 2016-01-15 DIAGNOSIS — Z87891 Personal history of nicotine dependence: Secondary | ICD-10-CM | POA: Diagnosis not present

## 2016-01-15 DIAGNOSIS — K589 Irritable bowel syndrome without diarrhea: Secondary | ICD-10-CM | POA: Insufficient documentation

## 2016-01-15 DIAGNOSIS — M25512 Pain in left shoulder: Secondary | ICD-10-CM | POA: Diagnosis not present

## 2016-01-15 DIAGNOSIS — Z79891 Long term (current) use of opiate analgesic: Secondary | ICD-10-CM

## 2016-01-15 DIAGNOSIS — M25559 Pain in unspecified hip: Secondary | ICD-10-CM | POA: Diagnosis not present

## 2016-01-15 DIAGNOSIS — F418 Other specified anxiety disorders: Secondary | ICD-10-CM | POA: Insufficient documentation

## 2016-01-15 DIAGNOSIS — M5481 Occipital neuralgia: Secondary | ICD-10-CM | POA: Diagnosis not present

## 2016-01-15 DIAGNOSIS — M792 Neuralgia and neuritis, unspecified: Secondary | ICD-10-CM

## 2016-01-15 DIAGNOSIS — F1190 Opioid use, unspecified, uncomplicated: Secondary | ICD-10-CM

## 2016-01-15 DIAGNOSIS — M25552 Pain in left hip: Secondary | ICD-10-CM | POA: Insufficient documentation

## 2016-01-15 DIAGNOSIS — E538 Deficiency of other specified B group vitamins: Secondary | ICD-10-CM

## 2016-01-15 DIAGNOSIS — R51 Headache: Secondary | ICD-10-CM | POA: Diagnosis not present

## 2016-01-15 DIAGNOSIS — Z5181 Encounter for therapeutic drug level monitoring: Secondary | ICD-10-CM | POA: Diagnosis not present

## 2016-01-15 DIAGNOSIS — Z87828 Personal history of other (healed) physical injury and trauma: Secondary | ICD-10-CM

## 2016-01-15 DIAGNOSIS — M25551 Pain in right hip: Secondary | ICD-10-CM | POA: Insufficient documentation

## 2016-01-15 DIAGNOSIS — M545 Low back pain, unspecified: Secondary | ICD-10-CM

## 2016-01-15 DIAGNOSIS — M5412 Radiculopathy, cervical region: Secondary | ICD-10-CM

## 2016-01-15 DIAGNOSIS — M549 Dorsalgia, unspecified: Secondary | ICD-10-CM

## 2016-01-15 DIAGNOSIS — M542 Cervicalgia: Secondary | ICD-10-CM | POA: Insufficient documentation

## 2016-01-15 DIAGNOSIS — M5416 Radiculopathy, lumbar region: Secondary | ICD-10-CM

## 2016-01-15 DIAGNOSIS — G4486 Cervicogenic headache: Secondary | ICD-10-CM

## 2016-01-15 DIAGNOSIS — M546 Pain in thoracic spine: Secondary | ICD-10-CM | POA: Diagnosis not present

## 2016-01-15 DIAGNOSIS — K219 Gastro-esophageal reflux disease without esophagitis: Secondary | ICD-10-CM | POA: Diagnosis not present

## 2016-01-15 DIAGNOSIS — M533 Sacrococcygeal disorders, not elsewhere classified: Secondary | ICD-10-CM | POA: Insufficient documentation

## 2016-01-15 DIAGNOSIS — M47816 Spondylosis without myelopathy or radiculopathy, lumbar region: Secondary | ICD-10-CM

## 2016-01-15 DIAGNOSIS — G8929 Other chronic pain: Secondary | ICD-10-CM

## 2016-01-15 MED ORDER — GABAPENTIN 300 MG PO CAPS: 300.0000 mg | ORAL_CAPSULE | Freq: Every day | ORAL | Status: DC

## 2016-01-15 MED ORDER — OXYCODONE HCL 5 MG PO TABS: 5.0000 mg | ORAL_TABLET | Freq: Four times a day (QID) | ORAL | Status: DC | PRN

## 2016-01-15 NOTE — Progress Notes (Signed)
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 COMPLETE W/BEND 6+V DG SI JOINTS MR LUMBAR SPINE WO CONTRAST DG HIP UNILAT W OR W/O PELVIS 2-3 VIEWS RIGHT  Interventional Therapies: Scheduled:  None at this time.    Considering:   1. Diagnostic left sided cervical epidural steroid injection under fluoroscopic guidance, with a without sedation. 2. Diagnostic bilateral cervical facet block under fluoroscopic guidance and IV sedation. 3. Possible bilateral cervical facet radiofrequency ablation under fluoroscopic guidance and IV sedation. 4. Diagnostic bilateral greater occipital nerve block under fluoroscopic guidance, with a without sedation.  5. Possible bilateral greater occipital nerve radiofrequency  ablation under fluoroscopic guidance and IV sedation.  6. Diagnostic bilateral intra-articular shoulder joint injection under fluoroscopic guidance, with a without sedation.  7. Diagnostic bilateral suprascapular nerve block under fluoroscopic guidance, with a without  sedation.  8. Possible bilateral suprascapular nerve radiofrequency ablation under fluoroscopic guidance and IV sedation.  9. Diagnostic bilateral lumbar facet block under fluoroscopic guidance and IV sedation.  10. Possible bilateral lumbar facet radiofrequency ablation under fluoroscopic guidance and IV sedation.  11. Diagnostic bilateral sacroiliac joint block under fluoroscopic guidance, with a without sedation.  12. Possible bilateral sacroiliac joint radiofrequency ablation under fluoroscopic guidance and IV sedation.  13. Diagnostic right-sided L4-5 lumbar epidural steroid injection under fluoroscopic guidance, with a without sedation.  14. Diagnostic bilateral intra-articular hip joint injection under fluoroscopic guidance, with a without sedation.  15. Possible bilateral hip joint radiofrequency ablation under fluoroscopic guidance and IV sedation.    PRN Procedures:   1. Diagnostic left sided cervical epidural steroid injection under fluoroscopic guidance, with a without sedation. 2. Diagnostic bilateral cervical facet block under fluoroscopic guidance and IV sedation. 3. Diagnostic bilateral greater occipital nerve block under fluoroscopic guidance, with a without sedation.  4. Diagnostic bilateral intra-articular shoulder joint injection under fluoroscopic guidance, with a without sedation.  5. Diagnostic bilateral suprascapular nerve block under fluoroscopic guidance, with a without sedation.  6. Diagnostic bilateral lumbar facet block under fluoroscopic guidance and IV sedation.  7. Diagnostic bilateral sacroiliac joint block under fluoroscopic guidance, with a without sedation.  8. Diagnostic right-sided L4-5 lumbar epidural steroid injection under fluoroscopic guidance, with a without sedation.  9. Diagnostic bilateral intra-articular hip joint injection under fluoroscopic guidance, with a without sedation.    Referral(s) or Consult(s): None at this time.  New Prescriptions    OXYCODONE (OXY IR/ROXICODONE) 5 MG IMMEDIATE RELEASE TABLET    Take 1 tablet (5 mg total) by mouth every 6 (six) hours as needed for severe pain.    Medications administered during this visit: Allison Dalton had no medications administered during this visit.  Requested PM Follow-up: Return in 3 weeks (on 02/03/2016) for (1-Mo), Med-Mgmt, (PRN) Procedure.  Future Appointments Date Time Provider Department Center  02/11/2016 10:20 AM Delano Metz, MD [facility] None    Primary Care Physician: No primary care provider on file. Location: [facility] Note by: Frona Yost A. Laban Emperor, M.D, DABA, DABAPM, DABPM, DABIPP, FIPP  Pain Score Disclaimer: We use the NRS-11 scale. This is a self-reported, subjective measurement of pain severity with only modest accuracy. It is used primarily to identify changes within a particular patient. It must be understood that outpatient pain scales are significantly less accurate that those used for research, where they can be applied under ideal controlled circumstances with minimal exposure to variables. In reality, the score is likely to be a combination of pain intensity and pain affect, where pain affect describes the degree of emotional arousal or changes in action readiness caused by the sensory experience of pain. Factors such as social and work situation, setting, emotional state, anxiety levels, expectation, and prior pain experience may influence pain perception and show large inter-individual differences that may also be affected by time variables.  Patient instructions provided during this appointment: There are no Patient Instructions on file for this visit.

## 2016-01-15 NOTE — Progress Notes (Signed)
Patient is here for evaluation of upper back and neck pain.  She states she tingling or electrical shocks that run down her arms and legs. Safety precautions to be maintained throughout the outpatient stay will include: orient to surroundings, keep bed in low position, maintain call bell within reach at all times, provide assistance with transfer out of bed and ambulation.

## 2016-01-17 DIAGNOSIS — G8929 Other chronic pain: Secondary | ICD-10-CM | POA: Insufficient documentation

## 2016-01-17 DIAGNOSIS — M25559 Pain in unspecified hip: Secondary | ICD-10-CM

## 2016-01-17 DIAGNOSIS — Z87828 Personal history of other (healed) physical injury and trauma: Secondary | ICD-10-CM | POA: Insufficient documentation

## 2016-02-11 ENCOUNTER — Encounter: Payer: Medicaid Other | Admitting: Pain Medicine

## 2016-02-24 ENCOUNTER — Ambulatory Visit: Payer: Medicaid Other | Attending: Pain Medicine | Admitting: Pain Medicine

## 2016-02-24 ENCOUNTER — Encounter: Payer: Self-pay | Admitting: Pain Medicine

## 2016-02-24 VITALS — BP 118/70 | HR 85 | Temp 97.8°F | Ht 63.0 in | Wt 200.0 lb

## 2016-02-24 DIAGNOSIS — M5412 Radiculopathy, cervical region: Secondary | ICD-10-CM | POA: Diagnosis not present

## 2016-02-24 DIAGNOSIS — M25512 Pain in left shoulder: Secondary | ICD-10-CM | POA: Diagnosis not present

## 2016-02-24 DIAGNOSIS — M47816 Spondylosis without myelopathy or radiculopathy, lumbar region: Secondary | ICD-10-CM

## 2016-02-24 DIAGNOSIS — Z87891 Personal history of nicotine dependence: Secondary | ICD-10-CM | POA: Diagnosis not present

## 2016-02-24 DIAGNOSIS — F1190 Opioid use, unspecified, uncomplicated: Secondary | ICD-10-CM

## 2016-02-24 DIAGNOSIS — R51 Headache: Secondary | ICD-10-CM | POA: Insufficient documentation

## 2016-02-24 DIAGNOSIS — Z79891 Long term (current) use of opiate analgesic: Secondary | ICD-10-CM | POA: Diagnosis not present

## 2016-02-24 DIAGNOSIS — F329 Major depressive disorder, single episode, unspecified: Secondary | ICD-10-CM

## 2016-02-24 DIAGNOSIS — M5481 Occipital neuralgia: Secondary | ICD-10-CM | POA: Diagnosis not present

## 2016-02-24 DIAGNOSIS — M533 Sacrococcygeal disorders, not elsewhere classified: Secondary | ICD-10-CM | POA: Insufficient documentation

## 2016-02-24 DIAGNOSIS — E119 Type 2 diabetes mellitus without complications: Secondary | ICD-10-CM | POA: Diagnosis not present

## 2016-02-24 DIAGNOSIS — F418 Other specified anxiety disorders: Secondary | ICD-10-CM

## 2016-02-24 DIAGNOSIS — G8929 Other chronic pain: Secondary | ICD-10-CM | POA: Insufficient documentation

## 2016-02-24 DIAGNOSIS — Z6835 Body mass index (BMI) 35.0-35.9, adult: Secondary | ICD-10-CM | POA: Insufficient documentation

## 2016-02-24 DIAGNOSIS — K219 Gastro-esophageal reflux disease without esophagitis: Secondary | ICD-10-CM | POA: Diagnosis not present

## 2016-02-24 DIAGNOSIS — E538 Deficiency of other specified B group vitamins: Secondary | ICD-10-CM | POA: Diagnosis not present

## 2016-02-24 DIAGNOSIS — M542 Cervicalgia: Secondary | ICD-10-CM | POA: Insufficient documentation

## 2016-02-24 DIAGNOSIS — F419 Anxiety disorder, unspecified: Secondary | ICD-10-CM | POA: Insufficient documentation

## 2016-02-24 DIAGNOSIS — M545 Low back pain: Secondary | ICD-10-CM | POA: Insufficient documentation

## 2016-02-24 DIAGNOSIS — G4486 Cervicogenic headache: Secondary | ICD-10-CM

## 2016-02-24 DIAGNOSIS — M25511 Pain in right shoulder: Secondary | ICD-10-CM | POA: Insufficient documentation

## 2016-02-24 DIAGNOSIS — M546 Pain in thoracic spine: Secondary | ICD-10-CM | POA: Diagnosis not present

## 2016-02-24 DIAGNOSIS — K589 Irritable bowel syndrome without diarrhea: Secondary | ICD-10-CM | POA: Diagnosis not present

## 2016-02-24 DIAGNOSIS — M5416 Radiculopathy, lumbar region: Secondary | ICD-10-CM | POA: Diagnosis not present

## 2016-02-24 DIAGNOSIS — M792 Neuralgia and neuritis, unspecified: Secondary | ICD-10-CM

## 2016-02-24 DIAGNOSIS — M549 Dorsalgia, unspecified: Secondary | ICD-10-CM

## 2016-02-24 MED ORDER — OXYCODONE HCL 5 MG PO TABS: 5.0000 mg | ORAL_TABLET | Freq: Two times a day (BID) | ORAL | 0 refills | Status: DC | PRN

## 2016-02-24 MED ORDER — OXYCODONE HCL 5 MG PO TABS: 5.0000 mg | ORAL_TABLET | Freq: Four times a day (QID) | ORAL | 0 refills | Status: DC | PRN

## 2016-02-24 MED ORDER — GABAPENTIN 300 MG PO CAPS: 300.0000 mg | ORAL_CAPSULE | Freq: Every day | ORAL | 0 refills | Status: DC

## 2016-02-24 NOTE — Progress Notes (Signed)
Safety precautions to be maintained throughout the outpatient stay will include: orient to surroundings, keep bed in low position, maintain call bell within reach at all times, provide assistance with transfer out of bed and ambulation. Bottle labeled Oxycodone 5 mg # 49/120  Filled 01-15-16

## 2016-02-24 NOTE — Patient Instructions (Signed)
Facet Blocks Patient Information  Description: The facets are joints in the spine between the vertebrae.  Like any joints in the body, facets can become irritated and painful.  Arthritis can also effect the facets.  By injecting steroids and local anesthetic in and around these joints, we can temporarily block the nerve supply to them.  Steroids act directly on irritated nerves and tissues to reduce selling and inflammation which often leads to decreased pain.  Facet blocks may be done anywhere along the spine from the neck to the low back depending upon the location of your pain.   After numbing the skin with local anesthetic (like Novocaine), a small needle is passed onto the facet joints under x-ray guidance.  You may experience a sensation of pressure while this is being done.  The entire block usually lasts about 15-25 minutes.   Conditions which may be treated by facet blocks:   Low back/buttock pain  Neck/shoulder pain  Certain types of headaches  Preparation for the injection:  1. Do not eat any solid food or dairy products within 8 hours of your appointment. 2. You may drink clear liquid up to 3 hours before appointment.  Clear liquids include water, black coffee, juice or soda.  No milk or cream please. 3. You may take your regular medication, including pain medications, with a sip of water before your appointment.  Diabetics should hold regular insulin (if taken separately) and take 1/2 normal NPH dose the morning of the procedure.  Carry some sugar containing items with you to your appointment. 4. A driver must accompany you and be prepared to drive you home after your procedure. 5. Bring all your current medications with you. 6. An IV may be inserted and sedation may be given at the discretion of the physician. 7. A blood pressure cuff, EKG and other monitors will often be applied during the procedure.  Some patients may need to have extra oxygen administered for a short  period. 8. You will be asked to provide medical information, including your allergies and medications, prior to the procedure.  We must know immediately if you are taking blood thinners (like Coumadin/Warfarin) or if you are allergic to IV iodine contrast (dye).  We must know if you could possible be pregnant.  Possible side-effects:   Bleeding from needle site  Infection (rare, may require surgery)  Nerve injury (rare)  Numbness & tingling (temporary)  Difficulty urinating (rare, temporary)  Spinal headache (a headache worse with upright posture)  Light-headedness (temporary)  Pain at injection site (serveral days)  Decreased blood pressure (rare, temporary)  Weakness in arm/leg (temporary)  Pressure sensation in back/neck (temporary)   Call if you experience:   Fever/chills associated with headache or increased back/neck pain  Headache worsened by an upright position  New onset, weakness or numbness of an extremity below the injection site  Hives or difficulty breathing (go to the emergency room)  Inflammation or drainage at the injection site(s)  Severe back/neck pain greater than usual  New symptoms which are concerning to you  Please note:  Although the local anesthetic injected can often make your back or neck feel good for several hours after the injection, the pain will likely return. It takes 3-7 days for steroids to work.  You may not notice any pain relief for at least one week.  If effective, we will often do a series of 2-3 injections spaced 3-6 weeks apart to maximally decrease your pain.  After the initial   series, you may be a candidate for a more permanent nerve block of the facets.  If you have any questions, please call #[phone] [facility]  DO NOT EAT OR DRINK FOR 8 HOURS PRIOR TO PROCEDURE BRING A DRIVER

## 2016-02-24 NOTE — Progress Notes (Signed)
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 physician. 7. A blood pressure cuff, EKG and other monitors will often be applied during the procedure.  Some patients may need to have extra oxygen administered for a short period. 8. You will be asked to provide medical information, including your allergies and medications, prior to the procedure.  We must know immediately if you are taking blood thinners (like Coumadin/Warfarin) or if you are allergic to IV iodine contrast (dye).  We must know if you could possible be pregnant.  Possible side-effects:   Bleeding from needle site  Infection (rare, may require surgery)  Nerve injury (rare)  Numbness & tingling (temporary)  Difficulty urinating (rare, temporary)  Spinal headache (a headache worse with upright posture)  Light-headedness (temporary)  Pain at injection site (serveral days)  Decreased blood pressure  (rare, temporary)  Weakness in arm/leg (temporary)  Pressure sensation in back/neck (temporary)   Call if you experience:   Fever/chills associated with headache or increased back/neck pain  Headache worsened by an upright position  New onset, weakness or numbness of an extremity below the injection site  Hives or difficulty breathing (go to the emergency room)  Inflammation or drainage at the injection site(s)  Severe back/neck pain greater than usual  New symptoms which are concerning to you  Please note:  Although the local anesthetic injected can often make your back or neck feel good for several hours after the injection, the pain will likely return. It takes 3-7 days for steroids to work.  You may not notice any pain relief for at least one week.  If effective, we will often do a series of 2-3 injections spaced 3-6 weeks apart to maximally decrease your pain.  After the initial series, you may be a candidate for a more permanent nerve block of the facets.  If you have any questions, please call #[phone] [facility]  DO NOT EAT OR DRINK FOR 8 HOURS PRIOR TO PROCEDURE BRING A DRIVER

## 2016-03-10 ENCOUNTER — Ambulatory Visit: Payer: Medicaid Other | Attending: Pain Medicine | Admitting: Pain Medicine

## 2016-03-10 ENCOUNTER — Encounter: Payer: Self-pay | Admitting: Pain Medicine

## 2016-03-10 VITALS — BP 99/63 | HR 72 | Temp 98.4°F | Resp 14 | Ht 63.0 in | Wt 206.0 lb

## 2016-03-10 DIAGNOSIS — E119 Type 2 diabetes mellitus without complications: Secondary | ICD-10-CM | POA: Insufficient documentation

## 2016-03-10 DIAGNOSIS — M25511 Pain in right shoulder: Secondary | ICD-10-CM | POA: Diagnosis not present

## 2016-03-10 DIAGNOSIS — M5481 Occipital neuralgia: Secondary | ICD-10-CM | POA: Insufficient documentation

## 2016-03-10 DIAGNOSIS — Z79891 Long term (current) use of opiate analgesic: Secondary | ICD-10-CM | POA: Insufficient documentation

## 2016-03-10 DIAGNOSIS — E538 Deficiency of other specified B group vitamins: Secondary | ICD-10-CM | POA: Diagnosis not present

## 2016-03-10 DIAGNOSIS — K219 Gastro-esophageal reflux disease without esophagitis: Secondary | ICD-10-CM | POA: Insufficient documentation

## 2016-03-10 DIAGNOSIS — R208 Other disturbances of skin sensation: Secondary | ICD-10-CM | POA: Diagnosis not present

## 2016-03-10 DIAGNOSIS — M545 Low back pain, unspecified: Secondary | ICD-10-CM

## 2016-03-10 DIAGNOSIS — M4726 Other spondylosis with radiculopathy, lumbar region: Secondary | ICD-10-CM | POA: Diagnosis not present

## 2016-03-10 DIAGNOSIS — G8929 Other chronic pain: Secondary | ICD-10-CM | POA: Diagnosis not present

## 2016-03-10 DIAGNOSIS — M546 Pain in thoracic spine: Secondary | ICD-10-CM | POA: Diagnosis present

## 2016-03-10 DIAGNOSIS — K529 Noninfective gastroenteritis and colitis, unspecified: Secondary | ICD-10-CM | POA: Insufficient documentation

## 2016-03-10 DIAGNOSIS — M47816 Spondylosis without myelopathy or radiculopathy, lumbar region: Secondary | ICD-10-CM | POA: Diagnosis not present

## 2016-03-10 DIAGNOSIS — M25552 Pain in left hip: Secondary | ICD-10-CM | POA: Insufficient documentation

## 2016-03-10 DIAGNOSIS — F418 Other specified anxiety disorders: Secondary | ICD-10-CM | POA: Insufficient documentation

## 2016-03-10 LAB — TOXASSURE SELECT 13 (MW), URINE: PDF: 0

## 2016-03-10 MED ORDER — FENTANYL CITRATE (PF) 100 MCG/2ML IJ SOLN
25.0000 ug | INTRAMUSCULAR | Status: DC | PRN
  Filled 2016-03-10: qty 2

## 2016-03-10 MED ORDER — LACTATED RINGERS IV SOLN: 1000.0000 mL | Freq: Once | INTRAVENOUS | Status: DC

## 2016-03-10 MED ORDER — MIDAZOLAM HCL 5 MG/5ML IJ SOLN
1.0000 mg | INTRAMUSCULAR | Status: DC | PRN
  Filled 2016-03-10: qty 5

## 2016-03-10 MED ORDER — ROPIVACAINE HCL 2 MG/ML IJ SOLN
9.0000 mL | Freq: Once | INTRAMUSCULAR | Status: AC
  Administered 2016-03-10: 9 mL
  Filled 2016-03-10: qty 10

## 2016-03-10 MED ORDER — LIDOCAINE HCL (PF) 1 % IJ SOLN
10.0000 mL | Freq: Once | INTRAMUSCULAR | Status: AC
  Administered 2016-03-10: 10 mL

## 2016-03-10 MED ORDER — TRIAMCINOLONE ACETONIDE 40 MG/ML IJ SUSP
40.0000 mg | Freq: Once | INTRAMUSCULAR | Status: AC
  Administered 2016-03-10: 40 mg
  Filled 2016-03-10: qty 1
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
## 2016-03-10 NOTE — Progress Notes (Signed)
Safety precautions to be maintained throughout the outpatient stay will include: orient to surroundings, keep bed in low position, maintain call bell within reach at all times, provide assistance with transfer out of bed and ambulation.  

## 2016-03-10 NOTE — Patient Instructions (Signed)
Pain Management Discharge Instructions  General Discharge Instructions :  If you need to reach your doctor call: Monday-Friday 8:00 am - 4:00 pm at [phone] or toll free [phone].  After clinic hours [phone] to have operator reach doctor.  Bring all of your medication bottles to all your appointments in the pain clinic.  To cancel or reschedule your appointment with Pain Management please remember to call 24 hours in advance to avoid a fee.  Refer to the educational materials which you have been given on: General Risks, I had my Procedure. Discharge Instructions, Post Sedation.  Post Procedure Instructions:  The drugs you were given will stay in your system until tomorrow, so for the next 24 hours you should not drive, make any legal decisions or drink any alcoholic beverages.  You may eat anything you prefer, but it is better to start with liquids then soups and crackers, and gradually work up to solid foods.  Please notify your doctor immediately if you have any unusual bleeding, trouble breathing or pain that is not related to your normal pain.  Depending on the type of procedure that was done, some parts of your body may feel week and/or numb.  This usually clears up by tonight or the next day.  Walk with the use of an assistive device or accompanied by an adult for the 24 hours.  You may use ice on the affected area for the first 24 hours.  Put ice in a Ziploc bag and cover with a towel and place against area 15 minutes on 15 minutes off.  You may switch to heat after 24 hours.GENERAL RISKS AND COMPLICATIONS  What are the risk, side effects and possible complications? Generally speaking, most procedures are safe.  However, with any procedure there are risks, side effects, and the possibility of complications.  The risks and complications are dependent upon the sites that are lesioned, or the type of nerve block to be performed.  The closer the procedure is to the spine,  the more serious the risks are.  Great care is taken when placing the radio frequency needles, block needles or lesioning probes, but sometimes complications can occur. 1. Infection: Any time there is an injection through the skin, there is a risk of infection.  This is why sterile conditions are used for these blocks.  There are four possible types of infection. 1. Localized skin infection. 2. Central Nervous System Infection-This can be in the form of Meningitis, which can be deadly. 3. Epidural Infections-This can be in the form of an epidural abscess, which can cause pressure inside of the spine, causing compression of the spinal cord with subsequent paralysis. This would require an emergency surgery to decompress, and there are no guarantees that the patient would recover from the paralysis. 4. Discitis-This is an infection of the intervertebral discs.  It occurs in about 1% of discography procedures.  It is difficult to treat and it may lead to surgery.        2. Pain: the needles have to go through skin and soft tissues, will cause soreness.       3. Damage to internal structures:  The nerves to be lesioned may be near blood vessels or    other nerves which can be potentially damaged.       4. Bleeding: Bleeding is more common if the patient is taking blood thinners such as  aspirin, Coumadin, Ticiid, Plavix, etc., or if he/she have some genetic predisposition  such as   hemophilia. Bleeding into the spinal canal can cause compression of the spinal  cord with subsequent paralysis.  This would require an emergency surgery to  decompress and there are no guarantees that the patient would recover from the  paralysis.       5. Pneumothorax:  Puncturing of a lung is a possibility, every time a needle is introduced in  the area of the chest or upper back.  Pneumothorax refers to free air around the  collapsed lung(s), inside of the thoracic cavity (chest cavity).  Another two possible  complications  related to a similar event would include: Hemothorax and Chylothorax.   These are variations of the Pneumothorax, where instead of air around the collapsed  lung(s), you may have blood or chyle, respectively.       6. Spinal headaches: They may occur with any procedures in the area of the spine.       7. Persistent CSF (Cerebro-Spinal Fluid) leakage: This is a rare problem, but may occur  with prolonged intrathecal or epidural catheters either due to the formation of a fistulous  track or a dural tear.       8. Nerve damage: By working so close to the spinal cord, there is always a possibility of  nerve damage, which could be as serious as a permanent spinal cord injury with  paralysis.       9. Death:  Although rare, severe deadly allergic reactions known as "Anaphylactic  reaction" can occur to any of the medications used.      10. Worsening of the symptoms:  We can always make thing worse.  What are the chances of something like this happening? Chances of any of this occuring are extremely low.  By statistics, you have more of a chance of getting killed in a motor vehicle accident: while driving to the hospital than any of the above occurring .  Nevertheless, you should be aware that they are possibilities.  In general, it is similar to taking a shower.  Everybody knows that you can slip, hit your head and get killed.  Does that mean that you should not shower again?  Nevertheless always keep in mind that statistics do not mean anything if you happen to be on the wrong side of them.  Even if a procedure has a 1 (one) in a 1,000,000 (million) chance of going wrong, it you happen to be that one..Also, keep in mind that by statistics, you have more of a chance of having something go wrong when taking medications.  Who should not have this procedure? If you are on a blood thinning medication (e.g. Coumadin, Plavix, see list of "Blood Thinners"), or if you have an active infection going on, you should not  have the procedure.  If you are taking any blood thinners, please inform your physician.  How should I prepare for this procedure?  Do not eat or drink anything at least six hours prior to the procedure.  Bring a driver with you .  It cannot be a taxi.  Come accompanied by an adult that can drive you back, and that is strong enough to help you if your legs get weak or numb from the local anesthetic.  Take all of your medicines the morning of the procedure with just enough water to swallow them.  If you have diabetes, make sure that you are scheduled to have your procedure done first thing in the morning, whenever possible.  If you have diabetes,   take only half of your insulin dose and notify our nurse that you have done so as soon as you arrive at the clinic.  If you are diabetic, but only take blood sugar pills (oral hypoglycemic), then do not take them on the morning of your procedure.  You may take them after you have had the procedure.  Do not take aspirin or any aspirin-containing medications, at least eleven (11) days prior to the procedure.  They may prolong bleeding.  Wear loose fitting clothing that may be easy to take off and that you would not mind if it got stained with Betadine or blood.  Do not wear any jewelry or perfume  Remove any nail coloring.  It will interfere with some of our monitoring equipment.  NOTE: Remember that this is not meant to be interpreted as a complete list of all possible complications.  Unforeseen problems may occur.  BLOOD THINNERS The following drugs contain aspirin or other products, which can cause increased bleeding during surgery and should not be taken for 2 weeks prior to and 1 week after surgery.  If you should need take something for relief of minor pain, you may take acetaminophen which is found in Tylenol,m Datril, Anacin-3 and Panadol. It is not blood thinner. The products listed below are.  Do not take any of the products listed below  in addition to any listed on your instruction sheet.  A.P.C or A.P.C with Codeine Codeine Phosphate Capsules #3 Ibuprofen Ridaura  ABC compound Congesprin Imuran rimadil  Advil Cope Indocin Robaxisal  Alka-Seltzer Effervescent Pain Reliever and Antacid Coricidin or Coricidin-D  Indomethacin Rufen  Alka-Seltzer plus Cold Medicine Cosprin Ketoprofen S-A-C Tablets  Anacin Analgesic Tablets or Capsules Coumadin Korlgesic Salflex  Anacin Extra Strength Analgesic tablets or capsules CP-2 Tablets Lanoril Salicylate  Anaprox Cuprimine Capsules Levenox Salocol  Anexsia-D Dalteparin Magan Salsalate  Anodynos Darvon compound Magnesium Salicylate Sine-off  Ansaid Dasin Capsules Magsal Sodium Salicylate  Anturane Depen Capsules Marnal Soma  APF Arthritis pain formula Dewitt's Pills Measurin Stanback  Argesic Dia-Gesic Meclofenamic Sulfinpyrazone  Arthritis Bayer Timed Release Aspirin Diclofenac Meclomen Sulindac  Arthritis pain formula Anacin Dicumarol Medipren Supac  Analgesic (Safety coated) Arthralgen Diffunasal Mefanamic Suprofen  Arthritis Strength Bufferin Dihydrocodeine Mepro Compound Suprol  Arthropan liquid Dopirydamole Methcarbomol with Aspirin Synalgos  ASA tablets/Enseals Disalcid Micrainin Tagament  Ascriptin Doan's Midol Talwin  Ascriptin A/D Dolene Mobidin Tanderil  Ascriptin Extra Strength Dolobid Moblgesic Ticlid  Ascriptin with Codeine Doloprin or Doloprin with Codeine Momentum Tolectin  Asperbuf Duoprin Mono-gesic Trendar  Aspergum Duradyne Motrin or Motrin IB Triminicin  Aspirin plain, buffered or enteric coated Durasal Myochrisine Trigesic  Aspirin Suppositories Easprin Nalfon Trillsate  Aspirin with Codeine Ecotrin Regular or Extra Strength Naprosyn Uracel  Atromid-S Efficin Naproxen Ursinus  Auranofin Capsules Elmiron Neocylate Vanquish  Axotal Emagrin Norgesic Verin  Azathioprine Empirin or Empirin with Codeine Normiflo Vitamin E  Azolid Emprazil Nuprin Voltaren  Bayer  Aspirin plain, buffered or children's or timed BC Tablets or powders Encaprin Orgaran Warfarin Sodium  Buff-a-Comp Enoxaparin Orudis Zorpin  Buff-a-Comp with Codeine Equegesic Os-Cal-Gesic   Buffaprin Excedrin plain, buffered or Extra Strength Oxalid   Bufferin Arthritis Strength Feldene Oxphenbutazone   Bufferin plain or Extra Strength Feldene Capsules Oxycodone with Aspirin   Bufferin with Codeine Fenoprofen Fenoprofen Pabalate or Pabalate-SF   Buffets II Flogesic Panagesic   Buffinol plain or Extra Strength Florinal or Florinal with Codeine Panwarfarin   Buf-Tabs Flurbiprofen Penicillamine   Butalbital Compound Four-way cold tablets   Penicillin   Butazolidin Fragmin Pepto-Bismol   Carbenicillin Geminisyn Percodan   Carna Arthritis Reliever Geopen Persantine   Carprofen Gold's salt Persistin   Chloramphenicol Goody's Phenylbutazone   Chloromycetin Haltrain Piroxlcam   Clmetidine heparin Plaquenil   Cllnoril Hyco-pap Ponstel   Clofibrate Hydroxy chloroquine Propoxyphen         Before stopping any of these medications, be sure to consult the physician who ordered them.  Some, such as Coumadin (Warfarin) are ordered to prevent or treat serious conditions such as "deep thrombosis", "pumonary embolisms", and other heart problems.  The amount of time that you may need off of the medication may also vary with the medication and the reason for which you were taking it.  If you are taking any of these medications, please make sure you notify your pain physician before you undergo any procedures.         Facet Joint Block, Care After Refer to this sheet in the next few weeks. These instructions provide you with information on caring for yourself after your procedure. Your health care provider may also give you more specific instructions. Your treatment has been planned according to current medical practices, but problems sometimes occur. Call your health care provider if you have any problems  or questions after your procedure. HOME CARE INSTRUCTIONS   Keep track of the amount of pain relief you feel and how long it lasts.  Limit pain medicine within the first 4-6 hours after the procedure as directed by your health care provider.  Resume taking dietary supplements and medicines as directed by your health care provider.  You may resume your regular diet.  Do not apply heat near or over the injection site(s) for 24 hours.   Do not take a bath or soak in water (such as a pool or lake) for 24 hours.  Do not drive for 24 hours unless approved by your health care provider.  Avoid strenuous activity for 24 hours.  Remove your bandages the morning after the procedure.   If the injection site is tender, applying an ice pack may relieve some tenderness. To do this:  Put ice in a bag.  Place a towel between your skin and the bag.  Leave the ice on for 15-20 minutes, 3-4 times a day.  Keep follow-up appointments as directed by your health care provider. SEEK MEDICAL CARE IF:   Your pain is not controlled by your medicines.   There is drainage from the injection site.   There is significant bleeding or swelling at the injection site.  You have diabetes and your blood sugar is above 180 mg/dL. SEEK IMMEDIATE MEDICAL CARE IF:   You develop a fever of 101F (38.3C) or greater.   You have worsening pain or swelling around the injection site.   You have red streaking around the injection site.   You develop severe pain that is not controlled by your medicines.   You develop a headache, stiff neck, nausea, or vomiting.   Your eyes become very sensitive to light.   You have weakness, paralysis, or tingling in your arms or legs that was not present before the procedure.   You develop difficulty urinating or breathing.    This information is not intended to replace advice given to you by your health care provider. Make sure you discuss any questions you  have with your health care provider.   Document Released: 06/15/2012 Document Revised: 07/20/2014 Document Reviewed: 06/15/2012 Elsevier Interactive Patient Education 2016   [employer] Facet Joint Block The facet joints connect the bones of the spine (vertebrae). They make it possible for you to bend, twist, and make other movements with your spine. They also prevent you from overbending, overtwisting, and making other excessive movements.  A facet joint block is a procedure where a numbing medicine (anesthetic) is injected into a facet joint. Often, a type of anti-inflammatory medicine called a steroid is also injected. A facet joint block may be done for two reasons:   Diagnosis. A facet joint block may be done as a test to see whether neck or back pain is caused by a worn-down or infected facet joint. If the pain gets better after a facet joint block, it means the pain is probably coming from the facet joint. If the pain does not get better, it means the pain is probably not coming from the facet joint.   Therapy. A facet joint block may be done to relieve neck or back pain caused by a facet joint. A facet joint block is only done as a therapy if the pain does not improve with medicine, exercise programs, physical therapy, and other forms of pain management. LET YOUR HEALTH CARE PROVIDER KNOW ABOUT:   Any allergies you have.   All medicines you are taking, including vitamins, herbs, eyedrops, and over-the-counter medicines and creams.   Previous problems you or members of your family have had with the use of anesthetics.   Any blood disorders you have had.   Other health problems you have. RISKS AND COMPLICATIONS Generally, having a facet joint block is safe. However, as with any procedure, complications can occur. Possible complications associated with having a facet joint block include:   Bleeding.   Injury to a nerve near the injection site.   Pain at the injection site.    Weakness or numbness in areas controlled by nerves near the injection site.   Infection.   Temporary fluid retention.   Allergic reaction to anesthetics or medicines used during the procedure. BEFORE THE PROCEDURE   Follow your health care provider's instructions if you are taking dietary supplements or medicines. You may need to stop taking them or reduce your dosage.   Do not take any new dietary supplements or medicines without asking your health care provider first.   Follow your health care provider's instructions about eating and drinking before the procedure. You may need to stop eating and drinking several hours before the procedure.   Arrange to have an adult drive you home after the procedure. PROCEDURE  You may need to remove your clothing and dress in an open-back gown so that your health care provider can access your spine.   The procedure will be done while you are lying on an X-ray table. Most of the time you will be asked to lie on your stomach, but you may be asked to lie in a different position if an injection will be made in your neck.   Special machines will be used to monitor your oxygen levels, heart rate, and blood pressure.   If an injection will be made in your neck, an intravenous (IV) tube will be inserted into one of your veins. Fluids and medicine will flow directly into your body through the IV tube.   The area over the facet joint where the injection will be made will be cleaned with an antiseptic soap. The surrounding skin will be covered with sterile drapes.   An anesthetic will be applied to   your skin to make the injection area numb. You may feel a temporary stinging or burning sensation.   A video X-ray machine will be used to locate the joint. A contrast dye may be injected into the facet joint area to help with locating the joint.   When the joint is located, an anesthetic medicine will be injected into the joint through the  needle.   Your health care provider will ask you whether you feel pain relief. If you do feel relief, a steroid may be injected to provide pain relief for a longer period of time. If you do not feel relief or feel only partial relief, additional injections of an anesthetic may be made in other facet joints.   The needle will be removed, the skin will be cleansed, and bandages will be applied.  AFTER THE PROCEDURE   You will be observed for 15-30 minutes before being allowed to go home. Do not drive. Have an adult drive you or take a taxi or public transportation instead.   If you feel pain relief, the pain will return in several hours or days when the anesthetic wears off.   You may feel pain relief 2-14 days after the procedure. The amount of time this relief lasts varies from person to person.   It is normal to feel some tenderness over the injected area(s) for 2 days following the procedure.   If you have diabetes, you may have a temporary increase in blood sugar.   This information is not intended to replace advice given to you by your health care provider. Make sure you discuss any questions you have with your health care provider.   Document Released: 11/18/2006 Document Revised: 07/20/2014 Document Reviewed: 04/18/2012 Elsevier Interactive Patient Education 2016 [employer]  

## 2016-03-10 NOTE — Progress Notes (Signed)
1358 Versed 1 mg IV, Fentanyl 50 mcg IV 1409 Versed 2 mg IV, Fentanyl 50 mcg IV 1417 Versed 2 mg IV

## 2016-03-11 ENCOUNTER — Telehealth: Payer: Self-pay | Admitting: *Deleted

## 2016-03-11 NOTE — Telephone Encounter (Signed)
Left voice mail

## 2016-03-23 ENCOUNTER — Encounter: Payer: Medicaid Other | Admitting: Pain Medicine

## 2016-05-18 ENCOUNTER — Encounter: Payer: Self-pay | Admitting: Medical Oncology

## 2016-05-18 ENCOUNTER — Emergency Department
Admission: EM | Admit: 2016-05-18 | Discharge: 2016-05-18 | Disposition: A | Discharge status: Home / Self Care | Payer: Medicaid Other | Attending: Emergency Medicine | Admitting: Emergency Medicine

## 2016-05-18 DIAGNOSIS — Z87891 Personal history of nicotine dependence: Secondary | ICD-10-CM | POA: Diagnosis not present

## 2016-05-18 DIAGNOSIS — R590 Localized enlarged lymph nodes: Secondary | ICD-10-CM | POA: Diagnosis not present

## 2016-05-18 DIAGNOSIS — J45909 Unspecified asthma, uncomplicated: Secondary | ICD-10-CM | POA: Insufficient documentation

## 2016-05-18 DIAGNOSIS — L02411 Cutaneous abscess of right axilla: Secondary | ICD-10-CM | POA: Diagnosis present

## 2016-05-18 MED ORDER — CEPHALEXIN 500 MG PO CAPS: 500.0000 mg | ORAL_CAPSULE | Freq: Four times a day (QID) | ORAL | 0 refills | Status: DC
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
## 2016-05-18 NOTE — ED Triage Notes (Signed)
Pt reports abscess under rt arm x 2 weeks.

## 2016-05-18 NOTE — Discharge Instructions (Signed)
Advised extra strength Tylenol for pain.

## 2016-05-18 NOTE — ED Notes (Signed)
See triage note  States she developed a swollen area under right arm about 2 weeks ago    States increased pain  No redness noted

## 2016-10-27 ENCOUNTER — Telehealth: Payer: Self-pay

## 2016-10-27 ENCOUNTER — Emergency Department
Admission: EM | Admit: 2016-10-27 | Discharge: 2016-10-27 | Disposition: A | Discharge status: Home / Self Care | Payer: Medicaid Other | Attending: Student in an Organized Health Care Education/Training Program | Admitting: Student in an Organized Health Care Education/Training Program

## 2016-10-27 DIAGNOSIS — Z87891 Personal history of nicotine dependence: Secondary | ICD-10-CM | POA: Diagnosis not present

## 2016-10-27 DIAGNOSIS — J069 Acute upper respiratory infection, unspecified: Secondary | ICD-10-CM | POA: Diagnosis not present

## 2016-10-27 DIAGNOSIS — Z79899 Other long term (current) drug therapy: Secondary | ICD-10-CM | POA: Diagnosis not present

## 2016-10-27 DIAGNOSIS — R05 Cough: Secondary | ICD-10-CM | POA: Diagnosis present

## 2016-10-27 DIAGNOSIS — J45909 Unspecified asthma, uncomplicated: Secondary | ICD-10-CM | POA: Diagnosis not present

## 2016-10-27 DIAGNOSIS — B9789 Other viral agents as the cause of diseases classified elsewhere: Secondary | ICD-10-CM

## 2016-10-27 DIAGNOSIS — E119 Type 2 diabetes mellitus without complications: Secondary | ICD-10-CM | POA: Insufficient documentation

## 2016-10-27 LAB — URINALYSIS, COMPLETE (UACMP) WITH MICROSCOPIC
BACTERIA UA: NONE SEEN
BILIRUBIN URINE: NEGATIVE
Glucose, UA: NEGATIVE mg/dL
HGB URINE DIPSTICK: NEGATIVE
KETONES UR: NEGATIVE mg/dL
Leukocytes, UA: NEGATIVE
NITRITE: NEGATIVE
PROTEIN: 30 mg/dL — AB
Specific Gravity, Urine: 1.024 (ref 1.005–1.030)
pH: 5 (ref 5.0–8.0)

## 2016-10-27 LAB — POCT RAPID STREP A: Streptococcus, Group A Screen (Direct): NEGATIVE

## 2016-10-27 MED ORDER — FEXOFENADINE-PSEUDOEPHED ER 60-120 MG PO TB12: 1.0000 | ORAL_TABLET | Freq: Two times a day (BID) | ORAL | 0 refills | Status: DC

## 2016-10-27 MED ORDER — MAGIC MOUTHWASH W/LIDOCAINE: 5.0000 mL | Freq: Four times a day (QID) | ORAL | 0 refills | Status: DC

## 2016-10-27 MED ORDER — IBUPROFEN 600 MG PO TABS
600.0000 mg | ORAL_TABLET | Freq: Three times a day (TID) | ORAL | 0 refills | Status: DC | PRN
Start: 2016-10-27 — End: 2017-03-02
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
## 2016-10-27 NOTE — Telephone Encounter (Signed)
Pt has not been here since September pt says she stopped coming because a death in her family and she started to get really depressed pt would like to come back as a patient to see Dr Laban Emperor. Please tell me if I can schedule this patient for a follow up

## 2016-10-27 NOTE — ED Triage Notes (Signed)
Pt c/o sore throat with cough and sinus congestion for the past 3 days

## 2016-10-27 NOTE — ED Notes (Signed)
Throat red, sore. Pt a/o

## 2016-10-28 DIAGNOSIS — G43009 Migraine without aura, not intractable, without status migrainosus: Secondary | ICD-10-CM | POA: Insufficient documentation

## 2016-10-28 NOTE — Telephone Encounter (Signed)
Patient had a procedure the last time she was here.  I do not see any documented reason why she could not come back.  Schedule her for evaluation since it has been less than a year.

## 2016-11-19 ENCOUNTER — Ambulatory Visit: Payer: Medicaid Other | Attending: Pain Medicine | Admitting: Pain Medicine

## 2016-11-19 ENCOUNTER — Encounter: Payer: Self-pay | Admitting: Pain Medicine

## 2016-11-19 VITALS — BP 121/67 | HR 93 | Temp 98.9°F | Resp 18 | Ht 63.0 in | Wt 211.0 lb

## 2016-11-19 DIAGNOSIS — F419 Anxiety disorder, unspecified: Secondary | ICD-10-CM | POA: Diagnosis not present

## 2016-11-19 DIAGNOSIS — M25511 Pain in right shoulder: Secondary | ICD-10-CM | POA: Diagnosis not present

## 2016-11-19 DIAGNOSIS — G894 Chronic pain syndrome: Secondary | ICD-10-CM | POA: Diagnosis not present

## 2016-11-19 DIAGNOSIS — Z79891 Long term (current) use of opiate analgesic: Secondary | ICD-10-CM

## 2016-11-19 DIAGNOSIS — M542 Cervicalgia: Secondary | ICD-10-CM

## 2016-11-19 DIAGNOSIS — M25512 Pain in left shoulder: Secondary | ICD-10-CM | POA: Diagnosis not present

## 2016-11-19 DIAGNOSIS — F1190 Opioid use, unspecified, uncomplicated: Secondary | ICD-10-CM | POA: Diagnosis not present

## 2016-11-19 DIAGNOSIS — M4696 Unspecified inflammatory spondylopathy, lumbar region: Secondary | ICD-10-CM | POA: Diagnosis not present

## 2016-11-19 DIAGNOSIS — M5442 Lumbago with sciatica, left side: Secondary | ICD-10-CM | POA: Diagnosis not present

## 2016-11-19 DIAGNOSIS — M549 Dorsalgia, unspecified: Secondary | ICD-10-CM

## 2016-11-19 DIAGNOSIS — M5441 Lumbago with sciatica, right side: Secondary | ICD-10-CM | POA: Diagnosis not present

## 2016-11-19 DIAGNOSIS — S199XXS Unspecified injury of neck, sequela: Secondary | ICD-10-CM | POA: Insufficient documentation

## 2016-11-19 DIAGNOSIS — M7918 Myalgia, other site: Secondary | ICD-10-CM

## 2016-11-19 DIAGNOSIS — M545 Low back pain: Secondary | ICD-10-CM | POA: Insufficient documentation

## 2016-11-19 DIAGNOSIS — F329 Major depressive disorder, single episode, unspecified: Secondary | ICD-10-CM | POA: Insufficient documentation

## 2016-11-19 DIAGNOSIS — M546 Pain in thoracic spine: Secondary | ICD-10-CM | POA: Diagnosis present

## 2016-11-19 DIAGNOSIS — E119 Type 2 diabetes mellitus without complications: Secondary | ICD-10-CM | POA: Diagnosis not present

## 2016-11-19 DIAGNOSIS — G8929 Other chronic pain: Secondary | ICD-10-CM

## 2016-11-19 DIAGNOSIS — K219 Gastro-esophageal reflux disease without esophagitis: Secondary | ICD-10-CM | POA: Diagnosis not present

## 2016-11-19 DIAGNOSIS — M47816 Spondylosis without myelopathy or radiculopathy, lumbar region: Secondary | ICD-10-CM

## 2016-11-19 DIAGNOSIS — M791 Myalgia: Secondary | ICD-10-CM | POA: Diagnosis not present

## 2016-11-19 DIAGNOSIS — E538 Deficiency of other specified B group vitamins: Secondary | ICD-10-CM | POA: Insufficient documentation

## 2016-11-19 MED ORDER — CYCLOBENZAPRINE HCL 10 MG PO TABS: 10.0000 mg | ORAL_TABLET | Freq: Three times a day (TID) | ORAL | 0 refills | Status: DC | PRN

## 2016-11-19 NOTE — Progress Notes (Signed)
Safety precautions to be maintained throughout the outpatient stay will include: orient to surroundings, keep bed in low position, maintain call bell within reach at all times, provide assistance with transfer out of bed and ambulation.  

## 2016-11-19 NOTE — Patient Instructions (Addendum)
Naltrexone tablets What is this medicine? NALTREXONE (nal TREX one) helps you to remain free of your dependence on opiate drugs or alcohol. It blocks the 'high' that these substances can give you. This medicine is combined with counseling and support groups. This medicine may be used for other purposes; ask your health care provider or pharmacist if you have questions. COMMON BRAND NAME(S): Depade, ReVia What should I tell my health care provider before I take this medicine? They need to know if you have any of these conditions: -if you have used drugs or alcohol within 7 to 10 days -kidney disease -liver disease, including hepatitis -an unusual or allergic reaction to naltrexone, other medicines, foods, dyes, or preservatives -pregnant or trying to get pregnant -breast-feeding How should I use this medicine? Take this medicine by mouth with a full glass of water. Follow the directions on the prescription label. Do not take this medicine within 7 to 10 days of taking any opioid drugs. Take your medicine at regular intervals. Do not take your medicine more often than directed. Do not stop taking except on your doctor's advice. Talk to your pediatrician regarding the use of this medicine in children. Special care may be needed. Overdosage: If you think you have taken too much of this medicine contact a poison control center or emergency room at once. NOTE: This medicine is only for you. Do not share this medicine with others. What if I miss a dose? If you miss a dose and remember on the same day, take the missed dose. If you do not remember until the next day, ask your doctor or health care professional about rescheduling your doses. Do not take double or extra doses. What may interact with this medicine? Do not take this medicine with any of the following medications: -any prescription or street opioid drug like codiene, heroin, methadone This medicine may also interact with the following  medications: -disulfiram -thioridazine This list may not describe all possible interactions. Give your health care provider a list of all the medicines, herbs, non-prescription drugs, or dietary supplements you use. Also tell them if you smoke, drink alcohol, or use illegal drugs. Some items may interact with your medicine. What should I watch for while using this medicine? Your condition will be monitored carefully while you are receiving this medicine. Visit your doctor or health care professional regularly. For this medicine to be most effective you should attend any counseling or support groups that your doctor or health care professional recommends. Do not try to overcome the effects of the medicine by taking large amounts of narcotics or by drinking large amounts of alcohol. This can cause severe problems including death. Also, you may be more sensitive to lower doses of narcotics after you stop taking this medicine. If you are going to have surgery, tell your doctor or health care professional that you are taking this medicine. Do not treat yourself for coughs, colds, pain, or diarrhea. Ask your doctor or health care professional for advice. Some of the ingredients may interact with this medicine and cause side effects. Wear a medical ID bracelet or chain, and carry a card that describes your disease and details of your medicine and dosage times. You may get drowsy or dizzy. Do not drive, use machinery, or do anything that needs mental alertness until you know how this medicine affects you. Do not stand or sit up quickly, especially if you are an older patient. This reduces the risk of dizzy or fainting spells.   Alcohol may interfere with the effect of this medicine. Avoid alcoholic drinks. What side effects may I notice from receiving this medicine? Side effects that you should report to your doctor or health care professional as soon as possible: -allergic reactions like skin rash, itching or  hives, swelling of the face, lips, or tongue -breathing problems -changes in vision, hearing -confusion -dark urine -depressed mood -diarrhea -fast or irregular heart beat -hallucination, loss of contact with reality -light-colored stools -right upper belly pain -suicidal thoughts or other mood changes -unusually weak or tired -vomiting -yellowing of the eyes or skin Side effects that usually do not require medical attention (report to your doctor or health care professional if they continue or are bothersome): -aches, pains -change in sex drive or performance -feeling anxious -headache -loss of appetite, nausea -runny nose, sinus problems, sneezing -stomach pain -trouble sleeping This list may not describe all possible side effects. Call your doctor for medical advice about side effects. You may report side effects to FDA at 1-800-FDA-1088. Where should I keep my medicine? Keep out of the reach of children. Store at room temperature between 20 and 25 degrees C (68 and 77 degrees F). Throw away any unused medicine after the expiration date. NOTE: This sheet is a summary. It may not cover all possible information. If you have questions about this medicine, talk to your doctor, pharmacist, or health care provider.  2018 Elsevier/Gold Standard (2012-04-21 10:33:18) _______________________________________________________________  Preparing for Procedure with Sedation Instructions: . Oral Intake: Do not eat or drink anything for at least 8 hours prior to your procedure. . Transportation: Public transportation is not allowed. Bring an adult driver. The driver must be physically present in our waiting room before any procedure can be started. Physical Assistance: Bring an adult physically capable of assisting you, in the  Facet Joint Block The facet joints connect the bones of the spine (vertebrae). They make it possible for you to bend, twist, and make other movements with your  spine. They also keep you from bending too far, twisting too far, and making other excessive movements. A facet joint block is a procedure where a numbing medicine (anesthetic) is injected into a facet joint. Often, a type of anti-inflammatory medicine called a steroid is also injected. A facet joint block may be done to diagnose neck or back pain. If the pain gets better after a facet joint block, it means the pain is probably coming from the facet joint. If the pain does not get better, it means the pain is probably not coming from the facet joint. A facet joint block may also be done to relieve neck or back pain caused by an inflamed facet joint. A facet joint block is only done to relieve pain if the pain does not improve with other methods, such as medicine, exercise programs, and physical therapy. Tell a health care provider about:  Any allergies you have.  All medicines you are taking, including vitamins, herbs, eye drops, creams, and over-the-counter medicines.  Any problems you or family members have had with anesthetic medicines.  Any blood disorders you have.  Any surgeries you have had.  Any medical conditions you have.  Whether you are pregnant or may be pregnant. What are the risks? Generally, this is a safe procedure. However, problems may occur, including:  Bleeding.  Injury to a nerve near the injection site.  Pain at the injection site.  Weakness or numbness in areas controlled by nerves near the injection site.  Infection.  Temporary fluid retention.  Allergic reactions to medicines or dyes.  Injury to other structures or organs near the injection site. What happens before the procedure?  Follow instructions from your health care provider about eating or drinking restrictions.  Ask your health care provider about:  Changing or stopping your regular medicines. This is especially important if you are taking diabetes medicines or blood thinners.  Taking  medicines such as aspirin and ibuprofen. These medicines can thin your blood. Do not take these medicines before your procedure if your health care provider instructs you not to.  Do not take any new dietary supplements or medicines without asking your health care provider first.  Plan to have someone take you home after the procedure. What happens during the procedure?  You may need to remove your clothing and dress in an open-back gown.  The procedure will be done while you are lying on an X-ray table. You will most likely be asked to lie on your stomach, but you may be asked to lie in a different position if an injection will be made in your neck.  Machines will be used to monitor your oxygen levels, heart rate, and blood pressure.  If an injection will be made in your neck, an IV tube will be inserted into one of your veins. Fluids and medicine will flow directly into your body through the IV tube.  The area over the facet joint where the injection will be made will be cleaned with soap. The surrounding skin will be covered with clean drapes.  A numbing medicine (local anesthetic) will be applied to your skin. Your skin may sting or burn for a moment.  A video X-ray machine (fluoroscopy) will be used to locate the joint. In some cases, a CT scan may be used.  A contrast dye may be injected into the facet joint area to help locate the joint.  When the joint is located, an anesthetic will be injected into the joint through the needle.  Your health care provider will ask you whether you feel pain relief. If you do feel relief, a steroid may be injected to provide pain relief for a longer period of time. If you do not feel relief or feel only partial relief, additional injections of an anesthetic may be made in other facet joints.  The needle will be removed.  Your skin will be cleaned.  A bandage (dressing) will be applied over each injection site. The procedure may vary among health  care providers and hospitals. What happens after the procedure?  You will be observed for 15-30 minutes before being allowed to go home. This information is not intended to replace advice given to you by your health care provider. Make sure you discuss any questions you have with your health care provider. Document Released: 11/18/2006 Document Revised: 07/31/2015 Document Reviewed: 03/25/2015 Elsevier Interactive Patient Education  2017 [employer] GENERAL RISKS AND COMPLICATIONS  What are the risk, side effects and possible complications? Generally speaking, most procedures are safe.  However, with any procedure there are risks, side effects, and the possibility of complications.  The risks and complications are dependent upon the sites that are lesioned, or the type of nerve block to be performed.  The closer the procedure is to the spine, the more serious the risks are.  Great care is taken when placing the radio frequency needles, block needles or lesioning probes, but sometimes complications can occur. 1. Infection: Any time there is an  injection through the skin, there is a risk of infection.  This is why sterile conditions are used for these blocks.  There are four possible types of infection. 1. Localized skin infection. 2. Central Nervous System Infection-This can be in the form of Meningitis, which can be deadly. 3. Epidural Infections-This can be in the form of an epidural abscess, which can cause pressure inside of the spine, causing compression of the spinal cord with subsequent paralysis. This would require an emergency surgery to decompress, and there are no guarantees that the patient would recover from the paralysis. 4. Discitis-This is an infection of the intervertebral discs.  It occurs in about 1% of discography procedures.  It is difficult to treat and it may lead to surgery.        2. Pain: the needles have to go through skin and soft tissues, will cause soreness.        3. Damage to internal structures:  The nerves to be lesioned may be near blood vessels or    other nerves which can be potentially damaged.       4. Bleeding: Bleeding is more common if the patient is taking blood thinners such as  aspirin, Coumadin, Ticiid, Plavix, etc., or if he/she have some genetic predisposition  such as hemophilia. Bleeding into the spinal canal can cause compression of the spinal  cord with subsequent paralysis.  This would require an emergency surgery to  decompress and there are no guarantees that the patient would recover from the  paralysis.       5. Pneumothorax:  Puncturing of a lung is a possibility, every time a needle is introduced in  the area of the chest or upper back.  Pneumothorax refers to free air around the  collapsed lung(s), inside of the thoracic cavity (chest cavity).  Another two possible  complications related to a similar event would include: Hemothorax and Chylothorax.   These are variations of the Pneumothorax, where instead of air around the collapsed  lung(s), you may have blood or chyle, respectively.       6. Spinal headaches: They may occur with any procedures in the area of the spine.       7. Persistent CSF (Cerebro-Spinal Fluid) leakage: This is a rare problem, but may occur  with prolonged intrathecal or epidural catheters either due to the formation of a fistulous  track or a dural tear.       8. Nerve damage: By working so close to the spinal cord, there is always a possibility of  nerve damage, which could be as serious as a permanent spinal cord injury with  paralysis.       9. Death:  Although rare, severe deadly allergic reactions known as "Anaphylactic  reaction" can occur to any of the medications used.      10. Worsening of the symptoms:  We can always make thing worse.  What are the chances of something like this happening? Chances of any of this occuring are extremely low.  By statistics, you have more of a chance of getting killed in a  motor vehicle accident: while driving to the hospital than any of the above occurring .  Nevertheless, you should be aware that they are possibilities.  In general, it is similar to taking a shower.  Everybody knows that you can slip, hit your head and get killed.  Does that mean that you should not shower again?  Nevertheless always keep in mind that statistics  do not mean anything if you happen to be on the wrong side of them.  Even if a procedure has a 1 (one) in a 1,000,000 (million) chance of going wrong, it you happen to be that one..Also, keep in mind that by statistics, you have more of a chance of having something go wrong when taking medications.  Who should not have this procedure? If you are on a blood thinning medication (e.g. Coumadin, Plavix, see list of "Blood Thinners"), or if you have an active infection going on, you should not have the procedure.  If you are taking any blood thinners, please inform your physician.  How should I prepare for this procedure?  Do not eat or drink anything at least six hours prior to the procedure.  Bring a driver with you .  It cannot be a taxi.  Come accompanied by an adult that can drive you back, and that is strong enough to help you if your legs get weak or numb from the local anesthetic.  Take all of your medicines the morning of the procedure with just enough water to swallow them.  If you have diabetes, make sure that you are scheduled to have your procedure done first thing in the morning, whenever possible.  If you have diabetes, take only half of your insulin dose and notify our nurse that you have done so as soon as you arrive at the clinic.  If you are diabetic, but only take blood sugar pills (oral hypoglycemic), then do not take them on the morning of your procedure.  You may take them after you have had the procedure.  Do not take aspirin or any aspirin-containing medications, at least eleven (11) days prior to the procedure.  They  may prolong bleeding.  Wear loose fitting clothing that may be easy to take off and that you would not mind if it got stained with Betadine or blood.  Do not wear any jewelry or perfume  Remove any nail coloring.  It will interfere with some of our monitoring equipment.  NOTE: Remember that this is not meant to be interpreted as a complete list of all possible complications.  Unforeseen problems may occur.  BLOOD THINNERS The following drugs contain aspirin or other products, which can cause increased bleeding during surgery and should not be taken for 2 weeks prior to and 1 week after surgery.  If you should need take something for relief of minor pain, you may take acetaminophen which is found in Tylenol,m Datril, Anacin-3 and Panadol. It is not blood thinner. The products listed below are.  Do not take any of the products listed below in addition to any listed on your instruction sheet.  A.P.C or A.P.C with Codeine Codeine Phosphate Capsules #3 Ibuprofen Ridaura  ABC compound Congesprin Imuran rimadil  Advil Cope Indocin Robaxisal  Alka-Seltzer Effervescent Pain Reliever and Antacid Coricidin or Coricidin-D  Indomethacin Rufen  Alka-Seltzer plus Cold Medicine Cosprin Ketoprofen S-A-C Tablets  Anacin Analgesic Tablets or Capsules Coumadin Korlgesic Salflex  Anacin Extra Strength Analgesic tablets or capsules CP-2 Tablets Lanoril Salicylate  Anaprox Cuprimine Capsules Levenox Salocol  Anexsia-D Dalteparin Magan Salsalate  Anodynos Darvon compound Magnesium Salicylate Sine-off  Ansaid Dasin Capsules Magsal Sodium Salicylate  Anturane Depen Capsules Marnal Soma  APF Arthritis pain formula Dewitt's Pills Measurin Stanback  Argesic Dia-Gesic Meclofenamic Sulfinpyrazone  Arthritis Bayer Timed Release Aspirin Diclofenac Meclomen Sulindac  Arthritis pain formula Anacin Dicumarol Medipren Supac  Analgesic (Safety coated) Arthralgen Diffunasal Mefanamic Suprofen  Arthritis Strength Bufferin  Dihydrocodeine Mepro Compound Suprol  Arthropan liquid Dopirydamole Methcarbomol with Aspirin Synalgos  ASA tablets/Enseals Disalcid Micrainin Tagament  Ascriptin Doan's Midol Talwin  Ascriptin A/D Dolene Mobidin Tanderil  Ascriptin Extra Strength Dolobid Moblgesic Ticlid  Ascriptin with Codeine Doloprin or Doloprin with Codeine Momentum Tolectin  Asperbuf Duoprin Mono-gesic Trendar  Aspergum Duradyne Motrin or Motrin IB Triminicin  Aspirin plain, buffered or enteric coated Durasal Myochrisine Trigesic  Aspirin Suppositories Easprin Nalfon Trillsate  Aspirin with Codeine Ecotrin Regular or Extra Strength Naprosyn Uracel  Atromid-S Efficin Naproxen Ursinus  Auranofin Capsules Elmiron Neocylate Vanquish  Axotal Emagrin Norgesic Verin  Azathioprine Empirin or Empirin with Codeine Normiflo Vitamin E  Azolid Emprazil Nuprin Voltaren  Bayer Aspirin plain, buffered or children's or timed BC Tablets or powders Encaprin Orgaran Warfarin Sodium  Buff-a-Comp Enoxaparin Orudis Zorpin  Buff-a-Comp with Codeine Equegesic Os-Cal-Gesic   Buffaprin Excedrin plain, buffered or Extra Strength Oxalid   Bufferin Arthritis Strength Feldene Oxphenbutazone   Bufferin plain or Extra Strength Feldene Capsules Oxycodone with Aspirin   Bufferin with Codeine Fenoprofen Fenoprofen Pabalate or Pabalate-SF   Buffets II Flogesic Panagesic   Buffinol plain or Extra Strength Florinal or Florinal with Codeine Panwarfarin   Buf-Tabs Flurbiprofen Penicillamine   Butalbital Compound Four-way cold tablets Penicillin   Butazolidin Fragmin Pepto-Bismol   Carbenicillin Geminisyn Percodan   Carna Arthritis Reliever Geopen Persantine   Carprofen Gold's salt Persistin   Chloramphenicol Goody's Phenylbutazone   Chloromycetin Haltrain Piroxlcam   Clmetidine heparin Plaquenil   Cllnoril Hyco-pap Ponstel   Clofibrate Hydroxy chloroquine Propoxyphen         Before stopping any of these medications, be sure to consult the  physician who ordered them.  Some, such as Coumadin (Warfarin) are ordered to prevent or treat serious conditions such as "deep thrombosis", "pumonary embolisms", and other heart problems.  The amount of time that you may need off of the medication may also vary with the medication and the reason for which you were taking it.  If you are taking any of these medications, please make sure you notify your pain physician before you undergo any procedures.         . event you need help. This adult should keep you company at home for at least 6 hours after the procedure. . Blood Pressure Medicine: Take your blood pressure medicine with a sip of water the morning of the procedure. . Blood thinners:  . Diabetics on insulin: Notify the staff so that you can be scheduled 1st case in the morning. If your diabetes requires high dose insulin, take only  of your normal insulin dose the morning of the procedure and notify the staff that you have done so. . Preventing infections: Shower with an antibacterial soap the morning of your procedure. . Build-up your immune system: Take 1000 mg of Vitamin C with every meal (3 times a day) the day prior to your procedure. Marland Kitchen Antibiotics: Inform the staff if you have a condition or reason that requires you to take antibiotics before dental procedures. . Pregnancy: If you are pregnant, call and cancel the procedure. . Sickness: If you have a cold, fever, or any active infections, call and cancel the procedure. . Arrival: You must be in the facility at least 30 minutes prior to your scheduled procedure. . Children: Do not bring children with you. . Dress appropriately: Bring dark clothing that you would not mind if they get stained. . Valuables: Do not bring any jewelry  or valuables. Procedure appointments are reserved for interventional treatments only. Marland Kitchen. No Prescription Refills. . No medication changes will be discussed during procedure appointments. . No disability  issues will be discussed. ______________________________________________________________________________________________

## 2016-11-19 NOTE — Progress Notes (Signed)
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 surgeries you have had.  Any medical conditions you have.  Whether you are pregnant or may be pregnant. What are the risks? Generally, this is a safe procedure. However, problems may occur, including:  Bleeding.  Injury to a nerve near the injection site.  Pain at the injection site.  Weakness or numbness in areas controlled by nerves near the injection site.  Infection.  Temporary fluid retention.  Allergic reactions to medicines or dyes.  Injury to other structures or organs near the injection site. What happens before the procedure?  Follow instructions from your health care provider about eating or drinking restrictions.  Ask your health care provider about:  Changing or stopping your regular medicines. This is especially important if you are taking diabetes medicines or blood thinners.  Taking medicines such as aspirin and ibuprofen. These medicines can thin your blood. Do not take these  medicines before your procedure if your health care provider instructs you not to.  Do not take any new dietary supplements or medicines without asking your health care provider first.  Plan to have someone take you home after the procedure. What happens during the procedure?  You may need to remove your clothing and dress in an open-back gown.  The procedure will be done  while you are lying on an X-ray table. You will most likely be asked to lie on your stomach, but you may be asked to lie in a different position if an injection will be made in your neck.  Machines will be used to monitor your oxygen levels, heart rate, and blood pressure.  If an injection will be made in your neck, an IV tube will be inserted into one of your veins. Fluids and medicine will flow directly into your body through the IV tube.  The area over the facet joint where the injection will be made will be cleaned with soap. The surrounding skin will be covered with clean drapes.  A numbing medicine (local anesthetic) will be applied to your skin. Your skin may sting or burn for a moment.  A video X-ray machine (fluoroscopy) will be used to locate the joint. In some cases, a CT scan may be used.  A contrast dye may be injected into the facet joint area to help locate the joint.  When the joint is located, an anesthetic will be injected into the joint through the needle.  Your health care provider will ask you whether you feel pain relief. If you do feel relief, a steroid may be injected to provide pain relief for a longer period of time. If you do not feel relief or feel only partial relief, additional injections of an anesthetic may be made in other facet joints.  The needle will be removed.  Your skin will be cleaned.  A bandage (dressing) will be applied over each injection site. The procedure may vary among health care providers and hospitals. What happens after the procedure?  You will be observed for  15-30 minutes before being allowed to go home. This information is not intended to replace advice given to you by your health care provider. Make sure you discuss any questions you have with your health care provider. Document Released: 11/18/2006 Document Revised: 07/31/2015 Document Reviewed: 03/25/2015 Elsevier Interactive Patient Education  2017 [place]  What are the risk, side effects and possible complications? Generally speaking, most procedures are safe.  However, with any procedure there are risks, side effects, and the possibility of complications.  The risks and complications are dependent upon the sites that are lesioned, or the type of nerve block to be performed.  The closer the procedure is to the spine, the more serious the risks are.  Great care is taken when placing the radio frequency needles, block needles or lesioning probes, but sometimes complications can occur. 1. Infection: Any time there is an injection through the skin, there is a risk of infection.  This is why sterile conditions are used for these blocks.  There are four possible types of infection. 1. Localized skin infection. 2. Central Nervous System Infection-This can be in the form of Meningitis, which can be deadly. 3. Epidural Infections-This can be in the form of an epidural abscess, which can cause pressure inside of the spine, causing compression of the spinal cord with subsequent paralysis. This would require an emergency surgery to decompress, and there are no guarantees that the patient would recover from the paralysis. 4. Discitis-This is an infection of the intervertebral discs.  It occurs in about 1% of discography procedures.  It is difficult to treat and it may lead to surgery.        2. Pain: the needles have to go through skin and soft tissues, will cause soreness.  3. Damage to internal structures:  The nerves to be lesioned may be near blood vessels or     other nerves which can be potentially damaged.       4. Bleeding: Bleeding is more common if the patient is taking blood thinners such as  aspirin, Coumadin, Ticiid, Plavix, etc., or if he/she have some genetic predisposition  such as hemophilia. Bleeding into the spinal canal can cause compression of the spinal  cord with subsequent paralysis.  This would require an emergency surgery to  decompress and there are no guarantees that the patient would recover from the  paralysis.       5. Pneumothorax:  Puncturing of a lung is a possibility, every time a needle is introduced in  the area of the chest or upper back.  Pneumothorax refers to free air around the  collapsed lung(s), inside of the thoracic cavity (chest cavity).  Another two possible  complications related to a similar event would include: Hemothorax and Chylothorax.   These are variations of the Pneumothorax, where instead of air around the collapsed  lung(s), you may have blood or chyle, respectively.       6. Spinal headaches: They may occur with any procedures in the area of the spine.       7. Persistent CSF (Cerebro-Spinal Fluid) leakage: This is a rare problem, but may occur  with prolonged intrathecal or epidural catheters either due to the formation of a fistulous  track or a dural tear.       8. Nerve damage: By working so close to the spinal cord, there is always a possibility of  nerve damage, which could be as serious as a permanent spinal cord injury with  paralysis.       9. Death:  Although rare, severe deadly allergic reactions known as "Anaphylactic  reaction" can occur to any of the medications used.      10. Worsening of the symptoms:  We can always make thing worse.  What are the chances of something like this happening? Chances of any of this occuring are extremely low.  By statistics, you have more of a chance of getting killed in a motor vehicle accident: while driving to the hospital than any of the above occurring .   Nevertheless, you should be aware that they are possibilities.  In general, it is similar to taking a shower.  Everybody knows that you can slip, hit your head and get killed.  Does that mean that you should not shower again?  Nevertheless always keep in mind that statistics do not mean anything if you happen to be on the wrong side of them.  Even if a procedure has a 1 (one) in a 1,000,000 (million) chance of going wrong, it you happen to be that one..Also, keep in mind that by statistics, you have more of a chance of having something go wrong when taking medications.  Who should not have this procedure? If you are on a blood thinning medication (e.g. Coumadin, Plavix, see list of "Blood Thinners"), or if you have an active infection going on, you should not have the procedure.  If you are taking any blood thinners, please inform your physician.  How should I prepare for this procedure?  Do not eat or drink anything at least six hours prior to the procedure.  Bring a driver with you .  It cannot be a taxi.  Come accompanied by an adult that can drive you back, and  that is strong enough to help you if your legs get weak or numb from the local anesthetic.  Take all of your medicines the morning of the procedure with just enough water to swallow them.  If you have diabetes, make sure that you are scheduled to have your procedure done first thing in the morning, whenever possible.  If you have diabetes, take only half of your insulin dose and notify our nurse that you have done so as soon as you arrive at the clinic.  If you are diabetic, but only take blood sugar pills (oral hypoglycemic), then do not take them on the morning of your procedure.  You may take them after you have had the procedure.  Do not take aspirin or any aspirin-containing medications, at least eleven (11) days prior to the procedure.  They may prolong bleeding.  Wear loose fitting clothing that may be easy to take off and  that you would not mind if it got stained with Betadine or blood.  Do not wear any jewelry or perfume  Remove any nail coloring.  It will interfere with some of our monitoring equipment.  NOTE: Remember that this is not meant to be interpreted as a complete list of all possible complications.  Unforeseen problems may occur.  BLOOD THINNERS The following drugs contain aspirin or other products, which can cause increased bleeding during surgery and should not be taken for 2 weeks prior to and 1 week after surgery.  If you should need take something for relief of minor pain, you may take acetaminophen which is found in Tylenol,m Datril, Anacin-3 and Panadol. It is not blood thinner. The products listed below are.  Do not take any of the products listed below in addition to any listed on your instruction sheet.  A.P.C or A.P.C with Codeine Codeine Phosphate Capsules #3 Ibuprofen Ridaura  ABC compound Congesprin Imuran rimadil  Advil Cope Indocin Robaxisal  Alka-Seltzer Effervescent Pain Reliever and Antacid Coricidin or Coricidin-D  Indomethacin Rufen  Alka-Seltzer plus Cold Medicine Cosprin Ketoprofen S-A-C Tablets  Anacin Analgesic Tablets or Capsules Coumadin Korlgesic Salflex  Anacin Extra Strength Analgesic tablets or capsules CP-2 Tablets Lanoril Salicylate  Anaprox Cuprimine Capsules Levenox Salocol  Anexsia-D Dalteparin Magan Salsalate  Anodynos Darvon compound Magnesium Salicylate Sine-off  Ansaid Dasin Capsules Magsal Sodium Salicylate  Anturane Depen Capsules Marnal Soma  APF Arthritis pain formula Dewitt's Pills Measurin Stanback  Argesic Dia-Gesic Meclofenamic Sulfinpyrazone  Arthritis Bayer Timed Release Aspirin Diclofenac Meclomen Sulindac  Arthritis pain formula Anacin Dicumarol Medipren Supac  Analgesic (Safety coated) Arthralgen Diffunasal Mefanamic Suprofen  Arthritis Strength Bufferin Dihydrocodeine Mepro Compound Suprol  Arthropan liquid Dopirydamole Methcarbomol with  Aspirin Synalgos  ASA tablets/Enseals Disalcid Micrainin Tagament  Ascriptin Doan's Midol Talwin  Ascriptin A/D Dolene Mobidin Tanderil  Ascriptin Extra Strength Dolobid Moblgesic Ticlid  Ascriptin with Codeine Doloprin or Doloprin with Codeine Momentum Tolectin  Asperbuf Duoprin Mono-gesic Trendar  Aspergum Duradyne Motrin or Motrin IB Triminicin  Aspirin plain, buffered or enteric coated Durasal Myochrisine Trigesic  Aspirin Suppositories Easprin Nalfon Trillsate  Aspirin with Codeine Ecotrin Regular or Extra Strength Naprosyn Uracel  Atromid-S Efficin Naproxen Ursinus  Auranofin Capsules Elmiron Neocylate Vanquish  Axotal Emagrin Norgesic Verin  Azathioprine Empirin or Empirin with Codeine Normiflo Vitamin E  Azolid Emprazil Nuprin Voltaren  Bayer Aspirin plain, buffered or children's or timed BC Tablets or powders Encaprin Orgaran Warfarin Sodium  Buff-a-Comp Enoxaparin Orudis Zorpin  Buff-a-Comp with Codeine Equegesic Os-Cal-Gesic   Buffaprin Excedrin plain, buffered  or Extra Strength Oxalid   Bufferin Arthritis Strength Feldene Oxphenbutazone   Bufferin plain or Extra Strength Feldene Capsules Oxycodone with Aspirin   Bufferin with Codeine Fenoprofen Fenoprofen Pabalate or Pabalate-SF   Buffets II Flogesic Panagesic   Buffinol plain or Extra Strength Florinal or Florinal with Codeine Panwarfarin   Buf-Tabs Flurbiprofen Penicillamine   Butalbital Compound Four-way cold tablets Penicillin   Butazolidin Fragmin Pepto-Bismol   Carbenicillin Geminisyn Percodan   Carna Arthritis Reliever Geopen Persantine   Carprofen Gold's salt Persistin   Chloramphenicol Goody's Phenylbutazone   Chloromycetin Haltrain Piroxlcam   Clmetidine heparin Plaquenil   Cllnoril Hyco-pap Ponstel   Clofibrate Hydroxy chloroquine Propoxyphen         Before stopping any of these medications, be sure to consult the physician who ordered them.  Some, such as Coumadin (Warfarin) are ordered to prevent or  treat serious conditions such as "deep thrombosis", "pumonary embolisms", and other heart problems.  The amount of time that you may need off of the medication may also vary with the medication and the reason for which you were taking it.  If you are taking any of these medications, please make sure you notify your pain physician before you undergo any procedures.         . event you need help. This adult should keep you company at home for at least 6 hours after the procedure. . Blood Pressure Medicine: Take your blood pressure medicine with a sip of water the morning of the procedure. . Blood thinners:  . Diabetics on insulin: Notify the staff so that you can be scheduled 1st case in the morning. If your diabetes requires high dose insulin, take only  of your normal insulin dose the morning of the procedure and notify the staff that you have done so. . Preventing infections: Shower with an antibacterial soap the morning of your procedure. . Build-up your immune system: Take 1000 mg of Vitamin C with every meal (3 times a day) the day prior to your procedure. Marland Kitchen Antibiotics: Inform the staff if you have a condition or reason that requires you to take antibiotics before dental procedures. . Pregnancy: If you are pregnant, call and cancel the procedure. . Sickness: If you have a cold, fever, or any active infections, call and cancel the procedure. . Arrival: You must be in the facility at least 30 minutes prior to your scheduled procedure. . Children: Do not bring children with you. . Dress appropriately: Bring dark clothing that you would not mind if they get stained. . Valuables: Do not bring any jewelry or valuables. Procedure appointments are reserved for interventional treatments only. Marland Kitchen No Prescription Refills. . No medication changes will be discussed during procedure appointments. . No disability issues will be  discussed. ______________________________________________________________________________________________

## 2016-11-23 ENCOUNTER — Ambulatory Visit (HOSPITAL_BASED_OUTPATIENT_CLINIC_OR_DEPARTMENT_OTHER): Payer: Medicaid Other | Admitting: Pain Medicine

## 2016-11-23 ENCOUNTER — Ambulatory Visit: Payer: Medicaid Other | Admitting: Pain Medicine

## 2016-11-23 ENCOUNTER — Ambulatory Visit
Admission: RE | Admit: 2016-11-23 | Discharge: 2016-11-23 | Disposition: A | Discharge status: Home / Self Care | Payer: Medicaid Other | Source: Ambulatory Visit | Attending: Pain Medicine | Admitting: Pain Medicine

## 2016-11-23 ENCOUNTER — Encounter: Payer: Self-pay | Admitting: Pain Medicine

## 2016-11-23 VITALS — BP 109/73 | HR 81 | Temp 97.2°F | Resp 13 | Ht 63.0 in | Wt 206.0 lb

## 2016-11-23 DIAGNOSIS — M47816 Spondylosis without myelopathy or radiculopathy, lumbar region: Secondary | ICD-10-CM | POA: Diagnosis not present

## 2016-11-23 DIAGNOSIS — M5442 Lumbago with sciatica, left side: Secondary | ICD-10-CM | POA: Diagnosis not present

## 2016-11-23 DIAGNOSIS — G8929 Other chronic pain: Secondary | ICD-10-CM

## 2016-11-23 DIAGNOSIS — M549 Dorsalgia, unspecified: Secondary | ICD-10-CM

## 2016-11-23 DIAGNOSIS — M5441 Lumbago with sciatica, right side: Secondary | ICD-10-CM

## 2016-11-23 DIAGNOSIS — M4696 Unspecified inflammatory spondylopathy, lumbar region: Secondary | ICD-10-CM

## 2016-11-23 MED ORDER — LIDOCAINE HCL (PF) 1 % IJ SOLN: 10.0000 mL | Freq: Once | INTRAMUSCULAR | Status: DC

## 2016-11-23 MED ORDER — FENTANYL CITRATE (PF) 100 MCG/2ML IJ SOLN
INTRAMUSCULAR | Status: AC
  Filled 2016-11-23: qty 2

## 2016-11-23 MED ORDER — ROPIVACAINE HCL 2 MG/ML IJ SOLN
INTRAMUSCULAR | Status: AC
  Filled 2016-11-23: qty 20

## 2016-11-23 MED ORDER — DIPHENHYDRAMINE HCL 50 MG/ML IJ SOLN
INTRAMUSCULAR | Status: AC
  Filled 2016-11-23: qty 1

## 2016-11-23 MED ORDER — ROPIVACAINE HCL 2 MG/ML IJ SOLN
9.0000 mL | Freq: Once | INTRAMUSCULAR | Status: AC
  Administered 2016-11-23: 10 mL via PERINEURAL

## 2016-11-23 MED ORDER — FENTANYL CITRATE (PF) 100 MCG/2ML IJ SOLN
25.0000 ug | INTRAMUSCULAR | Status: DC | PRN
  Administered 2016-11-23: 100 ug via INTRAVENOUS

## 2016-11-23 MED ORDER — TRIAMCINOLONE ACETONIDE 40 MG/ML IJ SUSP
INTRAMUSCULAR | Status: AC
  Filled 2016-11-23: qty 2

## 2016-11-23 MED ORDER — MIDAZOLAM HCL 5 MG/5ML IJ SOLN
1.0000 mg | INTRAMUSCULAR | Status: DC | PRN
  Administered 2016-11-23: 5 mg via INTRAVENOUS

## 2016-11-23 MED ORDER — TRIAMCINOLONE ACETONIDE 40 MG/ML IJ SUSP
40.0000 mg | Freq: Once | INTRAMUSCULAR | Status: AC
  Administered 2016-11-23: 40 mg

## 2016-11-23 MED ORDER — DIPHENHYDRAMINE HCL 50 MG/ML IJ SOLN
25.0000 mg | INTRAMUSCULAR | Status: AC | PRN
  Administered 2016-11-23: 25 mg via INTRAVENOUS

## 2016-11-23 MED ORDER — LACTATED RINGERS IV SOLN
1000.0000 mL | Freq: Once | INTRAVENOUS | Status: AC
  Administered 2016-11-23: 1000 mL via INTRAVENOUS

## 2016-11-23 MED ORDER — MIDAZOLAM HCL 5 MG/5ML IJ SOLN
INTRAMUSCULAR | Status: AC
  Filled 2016-11-23: qty 5

## 2016-11-23 NOTE — Progress Notes (Addendum)
Patient's Name: Allison Dalton  MRN: [id number]  Referring Provider: [facility]  DOB: 09-11-1990  PCP: [facility]  DOS: 11/23/2016  Note by: Sydnee Levans. Laban Emperor, MD  Service setting: Ambulatory outpatient  Location: [facility] (AMB) Pain Management Facility  Visit type: Procedure  Specialty: Interventional Pain Management  Patient type: Established   Primary Reason for Visit: Interventional Pain Management Treatment. CC: Back Pain (mid to upper)  Procedure:  Anesthesia, Analgesia, Anxiolysis:  Type: Diagnostic Medial Branch Facet Block #2 Region: Lumbar Level: L2, L3, L4, L5, & S1 Medial Branch Level(s) Laterality: Bilateral  Type: Local Anesthesia with Moderate (Conscious) Sedation Local Anesthetic: Lidocaine 1% Route: Intravenous (IV) IV Access: Secured Sedation: Meaningful verbal contact was maintained at all times during the procedure  Indication(s): Analgesia and Anxiety  NOTE: The patient was given 100 g of fentanyl + 5 mg of Versed + 25 mg of IV Benadryl without any visible effects. Clearly the patient has significant tolerance to these types of medications and/or the naltrexone simply blocked her from getting a benefit from the opioids. However, this would not explain her tolerance to the benzodiazepines or the diphenhydramine (Benadryl). The patient indicates having last taken her naltrexone yesterday. The peak naltrexone plasma concentration is reached within 1 hour of dosing. The naltrexone has an elimination half-life of 13 hours. (Complete elimination 13 x 5 = 65 hours)  Indications: 1. Chronic low back pain (Location of Tertiary source of pain) (Bilateral) (R>L)   2. Lumbar facet syndrome (Bilateral) (R>L)   3. Lumbar spondylosis   4. Chronic upper back pain (Location of Primary Source of Pain) (midline)   Duration of effects of naltrexone  Pain Score: Pre-procedure: 2 /10 Post-procedure: 2 /10  NOTE: The patient was observed during this  procedure to have absolutely no pain tolerance whatsoever. She screamed every time we infiltrated the skin with lidocaine despite the fact that we had given her 5 mg of IV midazolam +100 g of fentanyl +25 mg of IV Benadryl. I am very concerned about this since this takes away a lot of credibility away from the patient.  Pre-op Assessment:  Previous date of service: 11/19/16 Service provided: Evaluation Allison Dalton is a 26 y.o. (year old), female patient, seen today for interventional treatment. She  has a past surgical history that includes Cholecystectomy. Her primarily concern today is the Back Pain (mid to upper)  Initial Vital Signs: Blood pressure 114/66, pulse 82, temperature 99.5 F (37.5 C), temperature source Oral, resp. rate 16, height 5\' 3"  (1.6 m), weight 206 lb (93.4 kg), last menstrual period 10/26/2016, SpO2 100 %. BMI: 36.49 kg/m  Risk Assessment: Allergies: Reviewed. She is allergic to amoxicillin-pot clavulanate and other.  Allergy Precautions: None required Coagulopathies: Reviewed. None identified.  Blood-thinner therapy: None at this time Active Infection(s): Reviewed. None identified. Allison Dalton is afebrile  Site Confirmation: Allison Dalton was asked to confirm the procedure and laterality before marking the site Procedure checklist: Completed Consent: Before the procedure and under the influence of no sedative(s), amnesic(s), or anxiolytics, the patient was informed of the treatment options, risks and possible complications. To fulfill our ethical and legal obligations, as recommended by the American Medical Association's Code of Ethics, I have informed the patient of my clinical impression; the nature and purpose of the treatment or procedure; the risks, benefits, and possible complications of the intervention; the alternatives, including doing nothing; the risk(s) and benefit(s) of the alternative treatment(s) or procedure(s); and the risk(s) and benefit(s) of doing  nothing. The patient was provided information about the general risks and possible complications associated with the procedure. These may include, but are not limited to: failure to achieve desired goals, infection, bleeding, organ or nerve damage, allergic reactions, paralysis, and death. In addition, the patient was informed of those risks and complications associated to Spine-related procedures, such as failure to decrease pain; infection (i.e.: Meningitis, epidural or intraspinal abscess); bleeding (i.e.: epidural hematoma, subarachnoid hemorrhage, or any other type of intraspinal or peri-dural bleeding); organ or nerve damage (i.e.: Any type of peripheral nerve, nerve root, or spinal cord injury) with subsequent damage to sensory, motor, and/or autonomic systems, resulting in permanent pain, numbness, and/or weakness of one or several areas of the body; allergic reactions; (i.e.: anaphylactic reaction); and/or death. Furthermore, the patient was informed of those risks and complications associated with the medications. These include, but are not limited to: allergic reactions (i.e.: anaphylactic or anaphylactoid reaction(s)); adrenal axis suppression; blood sugar elevation that in diabetics may result in ketoacidosis or comma; water retention that in patients with history of congestive heart failure may result in shortness of breath, pulmonary edema, and decompensation with resultant heart failure; weight gain; swelling or edema; medication-induced neural toxicity; particulate matter embolism and blood vessel occlusion with resultant organ, and/or nervous system infarction; and/or aseptic necrosis of one or more joints. Finally, the patient was informed that Medicine is not an exact science; therefore, there is also the possibility of unforeseen or unpredictable risks and/or possible complications that may result in a catastrophic outcome. The patient indicated having understood very clearly. We have given  the patient no guarantees and we have made no promises. Enough time was given to the patient to ask questions, all of which were answered to the patient's satisfaction. Allison Dalton has indicated that she wanted to continue with the procedure. Attestation: I, the ordering provider, attest that I have discussed with the patient the benefits, risks, side-effects, alternatives, likelihood of achieving goals, and potential problems during recovery for the procedure that I have provided informed consent. Date: 11/23/2016; Time: 8:16 AM  Pre-Procedure Preparation:  Monitoring: As per clinic protocol. Respiration, ETCO2, SpO2, BP, heart rate and rhythm monitor placed and checked for adequate function Safety Precautions: Patient was assessed for positional comfort and pressure points before starting the procedure. Time-out: I initiated and conducted the "Time-out" before starting the procedure, as per protocol. The patient was asked to participate by confirming the accuracy of the "Time Out" information. Verification of the correct person, site, and procedure were performed and confirmed by me, the nursing staff, and the patient. "Time-out" conducted as per Joint Commission's Universal Protocol (UP.01.01.01). "Time-out" Date & Time: 11/23/2016; 0836 hrs.  Description of Procedure Process:   Position: Prone Target Area: For Lumbar Facet blocks, the target is the groove formed by the junction of the transverse process and superior articular process. For the L5 dorsal ramus, the target is the notch between superior articular process and sacral ala. For the S1 dorsal ramus, the target is the superior and lateral edge of the posterior S1 Sacral foramen. Approach: Paramedial approach. Area Prepped: Entire Posterior Lumbosacral Region Prepping solution: ChloraPrep (2% chlorhexidine gluconate and 70% isopropyl alcohol) Safety Precautions: Aspiration looking for blood return was conducted prior to all injections. At no  point did we inject any substances, as a needle was being advanced. No attempts were made at seeking any paresthesias. Safe injection practices and needle disposal techniques used. Medications properly checked for expiration dates. SDV (single dose vial)  medications used. Description of the Procedure: Protocol guidelines were followed. The patient was placed in position over the fluoroscopy table. The target area was identified and the area prepped in the usual manner. Skin desensitized using vapocoolant spray. Skin & deeper tissues infiltrated with local anesthetic. Appropriate amount of time allowed to pass for local anesthetics to take effect. The procedure needle was introduced through the skin, ipsilateral to the reported pain, and advanced to the target area. Employing the "Medial Branch Technique", the needles were advanced to the angle made by the superior and medial portion of the transverse process, and the lateral and inferior portion of the superior articulating process of the targeted vertebral bodies. This area is known as "Burton's Eye" or the "Eye of the [place] Dog". A procedure needle was introduced through the skin, and this time advanced to the angle made by the superior and medial border of the sacral ala, and the lateral border of the S1 vertebral body. This last needle was later repositioned at the superior and lateral border of the posterior S1 foramen. Negative aspiration confirmed. Solution injected in intermittent fashion, asking for systemic symptoms every 0.5cc of injectate. The needles were then removed and the area cleansed, making sure to leave some of the prepping solution back to take advantage of its long term bactericidal properties. Vitals:   11/23/16 0857 11/23/16 0907 11/23/16 0917 11/23/16 0927  BP: 103/79 120/64 116/74 109/73  Pulse: 91 90 83 81  Resp: 16 16 13 13   Temp:  97.2 F (36.2 C)    TempSrc:      SpO2: 100% 100% 100% 99%  Weight:      Height:         Start Time: 0836 hrs. End Time: 0857 hrs.  Illustration of the posterior view of the lumbar spine and the posterior neural structures. Laminae of L2 through S1 are labeled. DPRL5, dorsal primary ramus of L5; DPRS1, dorsal primary ramus of S1; DPR3, dorsal primary ramus of L3; FJ, facet (zygapophyseal) joint L3-L4; I, inferior articular process of L4; LB1, lateral branch of dorsal primary ramus of L1; IAB, inferior articular branches from L3 medial branch (supplies L4-L5 facet joint); IBP, intermediate branch plexus; MB3, medial branch of dorsal primary ramus of L3; NR3, third lumbar nerve root; S, superior articular process of L5; SAB, superior articular branches from L4 (supplies L4-5 facet joint also); TP3, transverse process of L3.  Materials:  Needle(s) Type: Regular needle Gauge: 22G Length: 3.5-in Medication(s): We administered lactated ringers, midazolam, fentaNYL, triamcinolone acetonide, triamcinolone acetonide, ropivacaine (PF) 2 mg/mL (0.2%), ropivacaine (PF) 2 mg/mL (0.2%), and diphenhydrAMINE. Please see chart orders for dosing details.  Imaging Guidance (Spinal):  Type of Imaging Technique: Fluoroscopy Guidance (Spinal) Indication(s): Assistance in needle guidance and placement for procedures requiring needle placement in or near specific anatomical locations not easily accessible without such assistance. Exposure Time: Please see nurses notes. Contrast: None used. Fluoroscopic Guidance: I was personally present during the use of fluoroscopy. "Tunnel Vision Technique" used to obtain the best possible view of the target area. Parallax error corrected before commencing the procedure. "Direction-depth-direction" technique used to introduce the needle under continuous pulsed fluoroscopy. Once target was reached, antero-posterior, oblique, and lateral fluoroscopic projection used confirm needle placement in all planes. Images permanently stored in EMR. Interpretation: No contrast  injected. I personally interpreted the imaging intraoperatively. Adequate needle placement confirmed in multiple planes. Permanent images saved into the patient's record.  Antibiotic Prophylaxis:  Indication(s): None identified Antibiotic given: None  Post-operative Assessment:  EBL: None Complications: No immediate post-treatment complications observed by team, or reported by patient. Note: The patient tolerated the entire procedure well. A repeat set of vitals were taken after the procedure and the patient was kept under observation following institutional policy, for this type of procedure. Post-procedural neurological assessment was performed, showing return to baseline, prior to discharge. The patient was provided with post-procedure discharge instructions, including a section on how to identify potential problems. Should any problems arise concerning this procedure, the patient was given instructions to immediately contact us, at any time, without hesitation. In any case, we plan to contact the patient by telephone for a follow-up status report regarding this interventional procedure. Comments:  No additional relevant information.  Plan of Care  Disposition: Discharge home  Discharge Date & Time: 11/23/2016; 0930 hrs.  Physician-requested Follow-up:  Return in about 2 weeks (around 12/07/2016) for post-procedure eval (in 2 wks), by MD.  Future Appointments Date Time Provider Department Center  11/30/2016 1:00 PM [facility] PT SUB THERAPIST 1 [facility] None  12/22/2016 8:00 AM Delano Metz, MD [facility] None   Medications ordered for procedure: Meds ordered this encounter  Medications  . lactated ringers infusion 1,000 mL  . midazolam (VERSED) 5 MG/5ML injection 1-2 mg    Make sure Flumazenil is available in the pyxis when using this medication. If oversedation occurs, administer 0.2 mg IV over 15 sec. If after 45 sec no response, administer 0.2 mg again over 1 min; may repeat at 1  min intervals; not to exceed 4 doses (1 mg)  . fentaNYL (SUBLIMAZE) injection 25-50 mcg    Make sure Narcan is available in the pyxis when using this medication. In the event of respiratory depression (RR< 8/min): Titrate NARCAN (naloxone) in increments of 0.1 to 0.2 mg IV at 2-3 minute intervals, until desired degree of reversal.  . triamcinolone acetonide (KENALOG-40) injection 40 mg  . lidocaine (PF) (XYLOCAINE) 1 % injection 10 mL  . triamcinolone acetonide (KENALOG-40) injection 40 mg  . ropivacaine (PF) 2 mg/mL (0.2%) (NAROPIN) injection 9 mL  . lidocaine (PF) (XYLOCAINE) 1 % injection 10 mL  . ropivacaine (PF) 2 mg/mL (0.2%) (NAROPIN) injection 9 mL  . diphenhydrAMINE (BENADRYL) injection 25 mg   Medications administered: We administered lactated ringers, midazolam, fentaNYL, triamcinolone acetonide, triamcinolone acetonide, ropivacaine (PF) 2 mg/mL (0.2%), ropivacaine (PF) 2 mg/mL (0.2%), and diphenhydrAMINE.  See the medical record for exact dosing, route, and time of administration.  Lab-work, Procedure(s), & Referral(s) Ordered: Orders Placed This Encounter  Procedures  . LUMBAR FACET(MEDIAL BRANCH NERVE BLOCK) MBNB  . DG C-Arm 1-60 Min-No Report  . DG Lumbar Spine Complete W/Bend  . DG Thoracic Spine 2 View  . Informed Consent Details: Transcribe to consent form and obtain patient signature  . Provider attestation of informed consent for procedure/surgical case  . Verify informed consent  . Discharge instructions  . Follow-up   Imaging Ordered: New Prescriptions   No medications on file   Primary Care Physician: [place], [place] Primary Care Location: [facility] Note by: Sydnee Levans. Laban Emperor, M.D, DABA, DABAPM, DABPM, DABIPP, FIPP Date: 11/23/2016; Time: 10:42 AM  Disclaimer:  Medicine is not an exact science. The only guarantee in medicine is that nothing is guaranteed. It is important to note that the decision to proceed with this  intervention was based on the information collected from the patient. The Data and conclusions were drawn from the patient's questionnaire, the interview, and the physical examination.  Because the information was provided in large part by the patient, it cannot be guaranteed that it has not been purposely or unconsciously manipulated. Every effort has been made to obtain as much relevant data as possible for this evaluation. It is important to note that the conclusions that lead to this procedure are derived in large part from the available data. Always take into account that the treatment will also be dependent on availability of resources and existing treatment guidelines, considered by other Pain Management Practitioners as being common knowledge and practice, at the time of the intervention. For Medico-Legal purposes, it is also important to point out that variation in procedural techniques and pharmacological choices are the acceptable norm. The indications, contraindications, technique, and results of the above procedure should only be interpreted and judged by a Board-Certified Interventional Pain Specialist with extensive familiarity and expertise in the same exact procedure and technique.  Instructions provided at this appointment: Patient Instructions   Post-Procedure instructions Instructions:  Apply ice: Fill a plastic sandwich bag with crushed ice. Cover it with a small towel and apply to injection site. Apply for 15 minutes then remove x 15 minutes. Repeat sequence on day of procedure, until you go to bed. The purpose is to minimize swelling and discomfort after procedure.  Apply heat: Apply heat to procedure site starting the day following the procedure. The purpose is to treat any soreness and discomfort from the procedure.  Food intake: Start with clear liquids (like water) and advance to regular food, as tolerated.   Physical activities: Keep activities to a minimum for the first 8  hours after the procedure.   Driving: If you have received any sedation, you are not allowed to drive for 24 hours after your procedure.  Blood thinner: Restart your blood thinner 6 hours after your procedure. (Only for those taking blood thinners)  Insulin: As soon as you can eat, you may resume your normal dosing schedule. (Only for those taking insulin)  Infection prevention: Keep procedure site clean and dry.  Post-procedure Pain Diary: Extremely important that this be done correctly and accurately. Recorded information will be used to determine the next step in treatment.  Pain evaluated is that of treated area only. Do not include pain from an untreated area.  Complete every hour, on the hour, for the initial 8 hours. Set an alarm to help you do this part accurately.  Do not go to sleep and have it completed later. It will not be accurate.  Follow-up appointment: Keep your follow-up appointment after the procedure. Usually 2 weeks for most procedures. (6 weeks in the case of radiofrequency.) Bring you pain diary.  Expect:  From numbing medicine (AKA: Local Anesthetics): Numbness or decrease in pain.  Onset: Full effect within 15 minutes of injected.  Duration: It will depend on the type of local anesthetic used. On the average, 1 to 8 hours.   From steroids: Decrease in swelling or inflammation. Once inflammation is improved, relief of the pain will follow.  Onset of benefits: Depends on the amount of swelling present. The more swelling, the longer it will take for the benefits to be seen.   Duration: Steroids will stay in the system x 2 weeks. Duration of benefits will depend on multiple posibilities including persistent irritating factors.  From procedure: Some discomfort is to be expected once the numbing medicine wears off. This should be minimal if ice and heat are applied as instructed. Call if:  You experience numbness and weakness  that gets worse with time, as  opposed to wearing off.  New onset bowel or bladder incontinence. (Spinal procedures only)  Emergency Numbers:  Durning business hours (Monday - Thursday, 8:00 AM - 4:00 PM) (Friday, 9:00 AM - 12:00 Noon): [phone]  After hours: [phone] _____________________________________________________________________________________________  Pain Management Discharge Instructions  General Discharge Instructions :  If you need to reach your doctor call: Monday-Friday 8:00 am - 4:00 pm at [phone] or toll free [phone].  After clinic hours [phone] to have operator reach doctor.  Bring all of your medication bottles to all your appointments in the pain clinic.  To cancel or reschedule your appointment with Pain Management please remember to call 24 hours in advance to avoid a fee.  Refer to the educational materials which you have been given on: General Risks, I had my Procedure. Discharge Instructions, Post Sedation.  Post Procedure Instructions:  The drugs you were given will stay in your system until tomorrow, so for the next 24 hours you should not drive, make any legal decisions or drink any alcoholic beverages.  You may eat anything you prefer, but it is better to start with liquids then soups and crackers, and gradually work up to solid foods.  Please notify your doctor immediately if you have any unusual bleeding, trouble breathing or pain that is not related to your normal pain.  Depending on the type of procedure that was done, some parts of your body may feel week and/or numb.  This usually clears up by tonight or the next day.  Walk with the use of an assistive device or accompanied by an adult for the 24 hours.  You may use ice on the affected area for the first 24 hours.  Put ice in a Ziploc bag and cover with a towel and place against area 15 minutes on 15 minutes off.  You may switch to heat after 24 hours. Facet Joint Block, Care After Refer to this  sheet in the next few weeks. These instructions provide you with information about caring for yourself after your procedure. Your health care provider may also give you more specific instructions. Your treatment has been planned according to current medical practices, but problems sometimes occur. Call your health care provider if you have any problems or questions after your procedure. What can I expect after the procedure? After the procedure, it is common to have:  Some tenderness over the injection sites for 2 days after the procedure.  A temporary increase in blood sugar if you have diabetes. Follow these instructions at home:  Keep track of the amount of pain relief you feel and how long it lasts.  Take over-the-counter and prescription medicines only as told by your health care provider. You may need to limit pain medicine within the first 4-6 hours after the procedure.  Remove your bandages (dressings) the morning after the procedure.  For the first 24 hours after the procedure:  Do not apply heat near or over the injection sites.  Do not take a bath or soak in water, such as in a pool or lake.  Do not drive or operate heavy machinery unless approved by your health care provider.  Avoid activities that require a lot of energy.  If the injection site is tender, try applying ice to the area. To do this:  Put ice in a plastic bag.  Place a towel between your skin and the bag.  Leave the ice on for 20 minutes, 2-3  times a day.  Keep all follow-up visits as told by your health care provider. This is important. Contact a health care provider if:  Fluid is coming from an injection site.  There is significant bleeding or swelling at an injection site.  You have diabetes and your blood sugar is above 180 mg/dL. Get help right away if:  You have a fever.  You have worsening pain or swelling around an injection site.  There are red streaks around an injection site.  You  develop severe pain that is not controlled by your medicines.  You develop a headache, stiff neck, nausea, or vomiting.  Your eyes become very sensitive to light.  You have weakness, paralysis, or tingling in your arms or legs that was not present before the procedure.  You have difficulty urinating or breathing. This information is not intended to replace advice given to you by your health care provider. Make sure you discuss any questions you have with your health care provider. Document Released: 06/15/2012 Document Revised: 11/13/2015 Document Reviewed: 03/25/2015 Elsevier Interactive Patient Education  2017 [employer]  Facet Joint Block The facet joints connect the bones of the spine (vertebrae). They make it possible for you to bend, twist, and make other movements with your spine. They also keep you from bending too far, twisting too far, and making other excessive movements. A facet joint block is a procedure where a numbing medicine (anesthetic) is injected into a facet joint. Often, a type of anti-inflammatory medicine called a steroid is also injected. A facet joint block may be done to diagnose neck or back pain. If the pain gets better after a facet joint block, it means the pain is probably coming from the facet joint. If the pain does not get better, it means the pain is probably not coming from the facet joint. A facet joint block may also be done to relieve neck or back pain caused by an inflamed facet joint. A facet joint block is only done to relieve pain if the pain does not improve with other methods, such as medicine, exercise programs, and physical therapy. Tell a health care provider about:  Any allergies you have.  All medicines you are taking, including vitamins, herbs, eye drops, creams, and over-the-counter medicines.  Any problems you or family members have had with anesthetic medicines.  Any blood disorders you have.  Any surgeries you have had.  Any  medical conditions you have.  Whether you are pregnant or may be pregnant. What are the risks? Generally, this is a safe procedure. However, problems may occur, including:  Bleeding.  Injury to a nerve near the injection site.  Pain at the injection site.  Weakness or numbness in areas controlled by nerves near the injection site.  Infection.  Temporary fluid retention.  Allergic reactions to medicines or dyes.  Injury to other structures or organs near the injection site. What happens before the procedure?  Follow instructions from your health care provider about eating or drinking restrictions.  Ask your health care provider about:  Changing or stopping your regular medicines. This is especially important if you are taking diabetes medicines or blood thinners.  Taking medicines such as aspirin and ibuprofen. These medicines can thin your blood. Do not take these medicines before your procedure if your health care provider instructs you not to.  Do not take any new dietary supplements or medicines without asking your health care provider first.  Plan to have someone take you home  after the procedure. What happens during the procedure?  You may need to remove your clothing and dress in an open-back gown.  The procedure will be done while you are lying on an X-ray table. You will most likely be asked to lie on your stomach, but you may be asked to lie in a different position if an injection will be made in your neck.  Machines will be used to monitor your oxygen levels, heart rate, and blood pressure.  If an injection will be made in your neck, an IV tube will be inserted into one of your veins. Fluids and medicine will flow directly into your body through the IV tube.  The area over the facet joint where the injection will be made will be cleaned with soap. The surrounding skin will be covered with clean drapes.  A numbing medicine (local anesthetic) will be applied to  your skin. Your skin may sting or burn for a moment.  A video X-ray machine (fluoroscopy) will be used to locate the joint. In some cases, a CT scan may be used.  A contrast dye may be injected into the facet joint area to help locate the joint.  When the joint is located, an anesthetic will be injected into the joint through the needle.  Your health care provider will ask you whether you feel pain relief. If you do feel relief, a steroid may be injected to provide pain relief for a longer period of time. If you do not feel relief or feel only partial relief, additional injections of an anesthetic may be made in other facet joints.  The needle will be removed.  Your skin will be cleaned.  A bandage (dressing) will be applied over each injection site. The procedure may vary among health care providers and hospitals. What happens after the procedure?  You will be observed for 15-30 minutes before being allowed to go home. This information is not intended to replace advice given to you by your health care provider. Make sure you discuss any questions you have with your health care provider. Document Released: 11/18/2006 Document Revised: 07/31/2015 Document Reviewed: 03/25/2015 [employer] Interactive Patient Education  2017 [employer] GENERAL RISKS AND COMPLICATIONS  What are the risk, side effects and possible complications? Generally speaking, most procedures are safe.  However, with any procedure there are risks, side effects, and the possibility of complications.  The risks and complications are dependent upon the sites that are lesioned, or the type of nerve block to be performed.  The closer the procedure is to the spine, the more serious the risks are.  Great care is taken when placing the radio frequency needles, block needles or lesioning probes, but sometimes complications can occur. 1. Infection: Any time there is an injection through the skin, there is a risk of infection.  This  is why sterile conditions are used for these blocks.  There are four possible types of infection. 1. Localized skin infection. 2. Central Nervous System Infection-This can be in the form of Meningitis, which can be deadly. 3. Epidural Infections-This can be in the form of an epidural abscess, which can cause pressure inside of the spine, causing compression of the spinal cord with subsequent paralysis. This would require an emergency surgery to decompress, and there are no guarantees that the patient would recover from the paralysis. 4. Discitis-This is an infection of the intervertebral discs.  It occurs in about 1% of discography procedures.  It is difficult to treat and  it may lead to surgery.        2. Pain: the needles have to go through skin and soft tissues, will cause soreness.       3. Damage to internal structures:  The nerves to be lesioned may be near blood vessels or    other nerves which can be potentially damaged.       4. Bleeding: Bleeding is more common if the patient is taking blood thinners such as  aspirin, Coumadin, Ticiid, Plavix, etc., or if he/she have some genetic predisposition  such as hemophilia. Bleeding into the spinal canal can cause compression of the spinal  cord with subsequent paralysis.  This would require an emergency surgery to  decompress and there are no guarantees that the patient would recover from the  paralysis.       5. Pneumothorax:  Puncturing of a lung is a possibility, every time a needle is introduced in  the area of the chest or upper back.  Pneumothorax refers to free air around the  collapsed lung(s), inside of the thoracic cavity (chest cavity).  Another two possible  complications related to a similar event would include: Hemothorax and Chylothorax.   These are variations of the Pneumothorax, where instead of air around the collapsed  lung(s), you may have blood or chyle, respectively.       6. Spinal headaches: They may occur with any procedures in  the area of the spine.       7. Persistent CSF (Cerebro-Spinal Fluid) leakage: This is a rare problem, but may occur  with prolonged intrathecal or epidural catheters either due to the formation of a fistulous  track or a dural tear.       8. Nerve damage: By working so close to the spinal cord, there is always a possibility of  nerve damage, which could be as serious as a permanent spinal cord injury with  paralysis.       9. Death:  Although rare, severe deadly allergic reactions known as "Anaphylactic  reaction" can occur to any of the medications used.      10. Worsening of the symptoms:  We can always make thing worse.  What are the chances of something like this happening? Chances of any of this occuring are extremely low.  By statistics, you have more of a chance of getting killed in a motor vehicle accident: while driving to the hospital than any of the above occurring .  Nevertheless, you should be aware that they are possibilities.  In general, it is similar to taking a shower.  Everybody knows that you can slip, hit your head and get killed.  Does that mean that you should not shower again?  Nevertheless always keep in mind that statistics do not mean anything if you happen to be on the wrong side of them.  Even if a procedure has a 1 (one) in a 1,000,000 (million) chance of going wrong, it you happen to be that one..Also, keep in mind that by statistics, you have more of a chance of having something go wrong when taking medications.  Who should not have this procedure? If you are on a blood thinning medication (e.g. Coumadin, Plavix, see list of "Blood Thinners"), or if you have an active infection going on, you should not have the procedure.  If you are taking any blood thinners, please inform your physician.  How should I prepare for this procedure?  Do not eat or drink anything at  least six hours prior to the procedure.  Bring a driver with you .  It cannot be a taxi.  Come  accompanied by an adult that can drive you back, and that is strong enough to help you if your legs get weak or numb from the local anesthetic.  Take all of your medicines the morning of the procedure with just enough water to swallow them.  If you have diabetes, make sure that you are scheduled to have your procedure done first thing in the morning, whenever possible.  If you have diabetes, take only half of your insulin dose and notify our nurse that you have done so as soon as you arrive at the clinic.  If you are diabetic, but only take blood sugar pills (oral hypoglycemic), then do not take them on the morning of your procedure.  You may take them after you have had the procedure.  Do not take aspirin or any aspirin-containing medications, at least eleven (11) days prior to the procedure.  They may prolong bleeding.  Wear loose fitting clothing that may be easy to take off and that you would not mind if it got stained with Betadine or blood.  Do not wear any jewelry or perfume  Remove any nail coloring.  It will interfere with some of our monitoring equipment.  NOTE: Remember that this is not meant to be interpreted as a complete list of all possible complications.  Unforeseen problems may occur.  BLOOD THINNERS The following drugs contain aspirin or other products, which can cause increased bleeding during surgery and should not be taken for 2 weeks prior to and 1 week after surgery.  If you should need take something for relief of minor pain, you may take acetaminophen which is found in Tylenol,m Datril, Anacin-3 and Panadol. It is not blood thinner. The products listed below are.  Do not take any of the products listed below in addition to any listed on your instruction sheet.  A.P.C or A.P.C with Codeine Codeine Phosphate Capsules #3 Ibuprofen Ridaura  ABC compound Congesprin Imuran rimadil  Advil Cope Indocin Robaxisal  Alka-Seltzer Effervescent Pain Reliever and Antacid Coricidin  or Coricidin-D  Indomethacin Rufen  Alka-Seltzer plus Cold Medicine Cosprin Ketoprofen S-A-C Tablets  Anacin Analgesic Tablets or Capsules Coumadin Korlgesic Salflex  Anacin Extra Strength Analgesic tablets or capsules CP-2 Tablets Lanoril Salicylate  Anaprox Cuprimine Capsules Levenox Salocol  Anexsia-D Dalteparin Magan Salsalate  Anodynos Darvon compound Magnesium Salicylate Sine-off  Ansaid Dasin Capsules Magsal Sodium Salicylate  Anturane Depen Capsules Marnal Soma  APF Arthritis pain formula Dewitt's Pills Measurin Stanback  Argesic Dia-Gesic Meclofenamic Sulfinpyrazone  Arthritis Bayer Timed Release Aspirin Diclofenac Meclomen Sulindac  Arthritis pain formula Anacin Dicumarol Medipren Supac  Analgesic (Safety coated) Arthralgen Diffunasal Mefanamic Suprofen  Arthritis Strength Bufferin Dihydrocodeine Mepro Compound Suprol  Arthropan liquid Dopirydamole Methcarbomol with Aspirin Synalgos  ASA tablets/Enseals Disalcid Micrainin Tagament  Ascriptin Doan's Midol Talwin  Ascriptin A/D Dolene Mobidin Tanderil  Ascriptin Extra Strength Dolobid Moblgesic Ticlid  Ascriptin with Codeine Doloprin or Doloprin with Codeine Momentum Tolectin  Asperbuf Duoprin Mono-gesic Trendar  Aspergum Duradyne Motrin or Motrin IB Triminicin  Aspirin plain, buffered or enteric coated Durasal Myochrisine Trigesic  Aspirin Suppositories Easprin Nalfon Trillsate  Aspirin with Codeine Ecotrin Regular or Extra Strength Naprosyn Uracel  Atromid-S Efficin Naproxen Ursinus  Auranofin Capsules Elmiron Neocylate Vanquish  Axotal Emagrin Norgesic Verin  Azathioprine Empirin or Empirin with Codeine Normiflo Vitamin E  Azolid Emprazil Nuprin Voltaren  [employer]  Aspirin plain, buffered or children's or timed BC Tablets or powders Encaprin Orgaran Warfarin Sodium  Buff-a-Comp Enoxaparin Orudis Zorpin  Buff-a-Comp with Codeine Equegesic Os-Cal-Gesic   Buffaprin Excedrin plain, buffered or Extra Strength Oxalid   Bufferin  Arthritis Strength Feldene Oxphenbutazone   Bufferin plain or Extra Strength Feldene Capsules Oxycodone with Aspirin   Bufferin with Codeine Fenoprofen Fenoprofen Pabalate or Pabalate-SF   Buffets II Flogesic Panagesic   Buffinol plain or Extra Strength Florinal or Florinal with Codeine Panwarfarin   Buf-Tabs Flurbiprofen Penicillamine   Butalbital Compound Four-way cold tablets Penicillin   Butazolidin Fragmin Pepto-Bismol   Carbenicillin Geminisyn Percodan   Carna Arthritis Reliever Geopen Persantine   Carprofen Gold's salt Persistin   Chloramphenicol Goody's Phenylbutazone   Chloromycetin Haltrain Piroxlcam   Clmetidine heparin Plaquenil   Cllnoril Hyco-pap Ponstel   Clofibrate Hydroxy chloroquine Propoxyphen         Before stopping any of these medications, be sure to consult the physician who ordered them.  Some, such as Coumadin (Warfarin) are ordered to prevent or treat serious conditions such as "deep thrombosis", "pumonary embolisms", and other heart problems.  The amount of time that you may need off of the medication may also vary with the medication and the reason for which you were taking it.  If you are taking any of these medications, please make sure you notify your pain physician before you undergo any procedures.

## 2016-11-23 NOTE — Patient Instructions (Addendum)
Post-Procedure instructions Instructions:  Apply ice: Fill a plastic sandwich bag with crushed ice. Cover it with a small towel and apply to injection site. Apply for 15 minutes then remove x 15 minutes. Repeat sequence on day of procedure, until you go to bed. The purpose is to minimize swelling and discomfort after procedure.  Apply heat: Apply heat to procedure site starting the day following the procedure. The purpose is to treat any soreness and discomfort from the procedure.  Food intake: Start with clear liquids (like water) and advance to regular food, as tolerated.   Physical activities: Keep activities to a minimum for the first 8 hours after the procedure.   Driving: If you have received any sedation, you are not allowed to drive for 24 hours after your procedure.  Blood thinner: Restart your blood thinner 6 hours after your procedure. (Only for those taking blood thinners)  Insulin: As soon as you can eat, you may resume your normal dosing schedule. (Only for those taking insulin)  Infection prevention: Keep procedure site clean and dry.  Post-procedure Pain Diary: Extremely important that this be done correctly and accurately. Recorded information will be used to determine the next step in treatment.  Pain evaluated is that of treated area only. Do not include pain from an untreated area.  Complete every hour, on the hour, for the initial 8 hours. Set an alarm to help you do this part accurately.  Do not go to sleep and have it completed later. It will not be accurate.  Follow-up appointment: Keep your follow-up appointment after the procedure. Usually 2 weeks for most procedures. (6 weeks in the case of radiofrequency.) Bring you pain diary.  Expect:  From numbing medicine (AKA: Local Anesthetics): Numbness or decrease in pain.  Onset: Full effect within 15 minutes of injected.  Duration: It will depend on the type of local anesthetic used. On the average, 1 to 8  hours.   From steroids: Decrease in swelling or inflammation. Once inflammation is improved, relief of the pain will follow.  Onset of benefits: Depends on the amount of swelling present. The more swelling, the longer it will take for the benefits to be seen.   Duration: Steroids will stay in the system x 2 weeks. Duration of benefits will depend on multiple posibilities including persistent irritating factors.  From procedure: Some discomfort is to be expected once the numbing medicine wears off. This should be minimal if ice and heat are applied as instructed. Call if:  You experience numbness and weakness that gets worse with time, as opposed to wearing off.  New onset bowel or bladder incontinence. (Spinal procedures only)  Emergency Numbers:  Durning business hours (Monday - Thursday, 8:00 AM - 4:00 PM) (Friday, 9:00 AM - 12:00 Noon): [phone]  After hours: [phone] _____________________________________________________________________________________________  Pain Management Discharge Instructions  General Discharge Instructions :  If you need to reach your doctor call: Monday-Friday 8:00 am - 4:00 pm at [phone] or toll free [phone].  After clinic hours [phone] to have operator reach doctor.  Bring all of your medication bottles to all your appointments in the pain clinic.  To cancel or reschedule your appointment with Pain Management please remember to call 24 hours in advance to avoid a fee.  Refer to the educational materials which you have been given on: General Risks, I had my Procedure. Discharge Instructions, Post Sedation.  Post Procedure Instructions:  The drugs you were given will stay in your system until  tomorrow, so for the next 24 hours you should not drive, make any legal decisions or drink any alcoholic beverages.  You may eat anything you prefer, but it is better to start with liquids then soups and crackers, and gradually work  up to solid foods.  Please notify your doctor immediately if you have any unusual bleeding, trouble breathing or pain that is not related to your normal pain.  Depending on the type of procedure that was done, some parts of your body may feel week and/or numb.  This usually clears up by tonight or the next day.  Walk with the use of an assistive device or accompanied by an adult for the 24 hours.  You may use ice on the affected area for the first 24 hours.  Put ice in a Ziploc bag and cover with a towel and place against area 15 minutes on 15 minutes off.  You may switch to heat after 24 hours. Facet Joint Block, Care After Refer to this sheet in the next few weeks. These instructions provide you with information about caring for yourself after your procedure. Your health care provider may also give you more specific instructions. Your treatment has been planned according to current medical practices, but problems sometimes occur. Call your health care provider if you have any problems or questions after your procedure. What can I expect after the procedure? After the procedure, it is common to have:  Some tenderness over the injection sites for 2 days after the procedure.  A temporary increase in blood sugar if you have diabetes. Follow these instructions at home:  Keep track of the amount of pain relief you feel and how long it lasts.  Take over-the-counter and prescription medicines only as told by your health care provider. You may need to limit pain medicine within the first 4-6 hours after the procedure.  Remove your bandages (dressings) the morning after the procedure.  For the first 24 hours after the procedure:  Do not apply heat near or over the injection sites.  Do not take a bath or soak in water, such as in a pool or lake.  Do not drive or operate heavy machinery unless approved by your health care provider.  Avoid activities that require a lot of energy.  If the  injection site is tender, try applying ice to the area. To do this:  Put ice in a plastic bag.  Place a towel between your skin and the bag.  Leave the ice on for 20 minutes, 2-3 times a day.  Keep all follow-up visits as told by your health care provider. This is important. Contact a health care provider if:  Fluid is coming from an injection site.  There is significant bleeding or swelling at an injection site.  You have diabetes and your blood sugar is above 180 mg/dL. Get help right away if:  You have a fever.  You have worsening pain or swelling around an injection site.  There are red streaks around an injection site.  You develop severe pain that is not controlled by your medicines.  You develop a headache, stiff neck, nausea, or vomiting.  Your eyes become very sensitive to light.  You have weakness, paralysis, or tingling in your arms or legs that was not present before the procedure.  You have difficulty urinating or breathing. This information is not intended to replace advice given to you by your health care provider. Make sure you discuss any questions you have  with your health care provider. Document Released: 06/15/2012 Document Revised: 11/13/2015 Document Reviewed: 03/25/2015 Elsevier Interactive Patient Education  2017 [employer]  Facet Joint Block The facet joints connect the bones of the spine (vertebrae). They make it possible for you to bend, twist, and make other movements with your spine. They also keep you from bending too far, twisting too far, and making other excessive movements. A facet joint block is a procedure where a numbing medicine (anesthetic) is injected into a facet joint. Often, a type of anti-inflammatory medicine called a steroid is also injected. A facet joint block may be done to diagnose neck or back pain. If the pain gets better after a facet joint block, it means the pain is probably coming from the facet joint. If the pain does  not get better, it means the pain is probably not coming from the facet joint. A facet joint block may also be done to relieve neck or back pain caused by an inflamed facet joint. A facet joint block is only done to relieve pain if the pain does not improve with other methods, such as medicine, exercise programs, and physical therapy. Tell a health care provider about:  Any allergies you have.  All medicines you are taking, including vitamins, herbs, eye drops, creams, and over-the-counter medicines.  Any problems you or family members have had with anesthetic medicines.  Any blood disorders you have.  Any surgeries you have had.  Any medical conditions you have.  Whether you are pregnant or may be pregnant. What are the risks? Generally, this is a safe procedure. However, problems may occur, including:  Bleeding.  Injury to a nerve near the injection site.  Pain at the injection site.  Weakness or numbness in areas controlled by nerves near the injection site.  Infection.  Temporary fluid retention.  Allergic reactions to medicines or dyes.  Injury to other structures or organs near the injection site. What happens before the procedure?  Follow instructions from your health care provider about eating or drinking restrictions.  Ask your health care provider about:  Changing or stopping your regular medicines. This is especially important if you are taking diabetes medicines or blood thinners.  Taking medicines such as aspirin and ibuprofen. These medicines can thin your blood. Do not take these medicines before your procedure if your health care provider instructs you not to.  Do not take any new dietary supplements or medicines without asking your health care provider first.  Plan to have someone take you home after the procedure. What happens during the procedure?  You may need to remove your clothing and dress in an open-back gown.  The procedure will be done  while you are lying on an X-ray table. You will most likely be asked to lie on your stomach, but you may be asked to lie in a different position if an injection will be made in your neck.  Machines will be used to monitor your oxygen levels, heart rate, and blood pressure.  If an injection will be made in your neck, an IV tube will be inserted into one of your veins. Fluids and medicine will flow directly into your body through the IV tube.  The area over the facet joint where the injection will be made will be cleaned with soap. The surrounding skin will be covered with clean drapes.  A numbing medicine (local anesthetic) will be applied to your skin. Your skin may sting or burn for a moment.  A video X-ray machine (fluoroscopy) will be used to locate the joint. In some cases, a CT scan may be used.  A contrast dye may be injected into the facet joint area to help locate the joint.  When the joint is located, an anesthetic will be injected into the joint through the needle.  Your health care provider will ask you whether you feel pain relief. If you do feel relief, a steroid may be injected to provide pain relief for a longer period of time. If you do not feel relief or feel only partial relief, additional injections of an anesthetic may be made in other facet joints.  The needle will be removed.  Your skin will be cleaned.  A bandage (dressing) will be applied over each injection site. The procedure may vary among health care providers and hospitals. What happens after the procedure?  You will be observed for 15-30 minutes before being allowed to go home. This information is not intended to replace advice given to you by your health care provider. Make sure you discuss any questions you have with your health care provider. Document Released: 11/18/2006 Document Revised: 07/31/2015 Document Reviewed: 03/25/2015 Elsevier Interactive Patient Education  2017 [employer] GENERAL RISKS  AND COMPLICATIONS  What are the risk, side effects and possible complications? Generally speaking, most procedures are safe.  However, with any procedure there are risks, side effects, and the possibility of complications.  The risks and complications are dependent upon the sites that are lesioned, or the type of nerve block to be performed.  The closer the procedure is to the spine, the more serious the risks are.  Great care is taken when placing the radio frequency needles, block needles or lesioning probes, but sometimes complications can occur. 1. Infection: Any time there is an injection through the skin, there is a risk of infection.  This is why sterile conditions are used for these blocks.  There are four possible types of infection. 1. Localized skin infection. 2. Central Nervous System Infection-This can be in the form of Meningitis, which can be deadly. 3. Epidural Infections-This can be in the form of an epidural abscess, which can cause pressure inside of the spine, causing compression of the spinal cord with subsequent paralysis. This would require an emergency surgery to decompress, and there are no guarantees that the patient would recover from the paralysis. 4. Discitis-This is an infection of the intervertebral discs.  It occurs in about 1% of discography procedures.  It is difficult to treat and it may lead to surgery.        2. Pain: the needles have to go through skin and soft tissues, will cause soreness.       3. Damage to internal structures:  The nerves to be lesioned may be near blood vessels or    other nerves which can be potentially damaged.       4. Bleeding: Bleeding is more common if the patient is taking blood thinners such as  aspirin, Coumadin, Ticiid, Plavix, etc., or if he/she have some genetic predisposition  such as hemophilia. Bleeding into the spinal canal can cause compression of the spinal  cord with subsequent paralysis.  This would require an emergency  surgery to  decompress and there are no guarantees that the patient would recover from the  paralysis.       5. Pneumothorax:  Puncturing of a lung is a possibility, every time a needle is introduced in  the area of  the chest or upper back.  Pneumothorax refers to free air around the  collapsed lung(s), inside of the thoracic cavity (chest cavity).  Another two possible  complications related to a similar event would include: Hemothorax and Chylothorax.   These are variations of the Pneumothorax, where instead of air around the collapsed  lung(s), you may have blood or chyle, respectively.       6. Spinal headaches: They may occur with any procedures in the area of the spine.       7. Persistent CSF (Cerebro-Spinal Fluid) leakage: This is a rare problem, but may occur  with prolonged intrathecal or epidural catheters either due to the formation of a fistulous  track or a dural tear.       8. Nerve damage: By working so close to the spinal cord, there is always a possibility of  nerve damage, which could be as serious as a permanent spinal cord injury with  paralysis.       9. Death:  Although rare, severe deadly allergic reactions known as "Anaphylactic  reaction" can occur to any of the medications used.      10. Worsening of the symptoms:  We can always make thing worse.  What are the chances of something like this happening? Chances of any of this occuring are extremely low.  By statistics, you have more of a chance of getting killed in a motor vehicle accident: while driving to the hospital than any of the above occurring .  Nevertheless, you should be aware that they are possibilities.  In general, it is similar to taking a shower.  Everybody knows that you can slip, hit your head and get killed.  Does that mean that you should not shower again?  Nevertheless always keep in mind that statistics do not mean anything if you happen to be on the wrong side of them.  Even if a procedure has a 1 (one) in a  1,000,000 (million) chance of going wrong, it you happen to be that one..Also, keep in mind that by statistics, you have more of a chance of having something go wrong when taking medications.  Who should not have this procedure? If you are on a blood thinning medication (e.g. Coumadin, Plavix, see list of "Blood Thinners"), or if you have an active infection going on, you should not have the procedure.  If you are taking any blood thinners, please inform your physician.  How should I prepare for this procedure?  Do not eat or drink anything at least six hours prior to the procedure.  Bring a driver with you .  It cannot be a taxi.  Come accompanied by an adult that can drive you back, and that is strong enough to help you if your legs get weak or numb from the local anesthetic.  Take all of your medicines the morning of the procedure with just enough water to swallow them.  If you have diabetes, make sure that you are scheduled to have your procedure done first thing in the morning, whenever possible.  If you have diabetes, take only half of your insulin dose and notify our nurse that you have done so as soon as you arrive at the clinic.  If you are diabetic, but only take blood sugar pills (oral hypoglycemic), then do not take them on the morning of your procedure.  You may take them after you have had the procedure.  Do not take aspirin or any aspirin-containing medications, at  least eleven (11) days prior to the procedure.  They may prolong bleeding.  Wear loose fitting clothing that may be easy to take off and that you would not mind if it got stained with Betadine or blood.  Do not wear any jewelry or perfume  Remove any nail coloring.  It will interfere with some of our monitoring equipment.  NOTE: Remember that this is not meant to be interpreted as a complete list of all possible complications.  Unforeseen problems may occur.  BLOOD THINNERS The following drugs contain aspirin  or other products, which can cause increased bleeding during surgery and should not be taken for 2 weeks prior to and 1 week after surgery.  If you should need take something for relief of minor pain, you may take acetaminophen which is found in Tylenol,m Datril, Anacin-3 and Panadol. It is not blood thinner. The products listed below are.  Do not take any of the products listed below in addition to any listed on your instruction sheet.  A.P.C or A.P.C with Codeine Codeine Phosphate Capsules #3 Ibuprofen Ridaura  ABC compound Congesprin Imuran rimadil  Advil Cope Indocin Robaxisal  Alka-Seltzer Effervescent Pain Reliever and Antacid Coricidin or Coricidin-D  Indomethacin Rufen  Alka-Seltzer plus Cold Medicine Cosprin Ketoprofen S-A-C Tablets  Anacin Analgesic Tablets or Capsules Coumadin Korlgesic Salflex  Anacin Extra Strength Analgesic tablets or capsules CP-2 Tablets Lanoril Salicylate  Anaprox Cuprimine Capsules Levenox Salocol  Anexsia-D Dalteparin Magan Salsalate  Anodynos Darvon compound Magnesium Salicylate Sine-off  Ansaid Dasin Capsules Magsal Sodium Salicylate  Anturane Depen Capsules Marnal Soma  APF Arthritis pain formula Dewitt's Pills Measurin Stanback  Argesic Dia-Gesic Meclofenamic Sulfinpyrazone  Arthritis Bayer Timed Release Aspirin Diclofenac Meclomen Sulindac  Arthritis pain formula Anacin Dicumarol Medipren Supac  Analgesic (Safety coated) Arthralgen Diffunasal Mefanamic Suprofen  Arthritis Strength Bufferin Dihydrocodeine Mepro Compound Suprol  Arthropan liquid Dopirydamole Methcarbomol with Aspirin Synalgos  ASA tablets/Enseals Disalcid Micrainin Tagament  Ascriptin Doan's Midol Talwin  Ascriptin A/D Dolene Mobidin Tanderil  Ascriptin Extra Strength Dolobid Moblgesic Ticlid  Ascriptin with Codeine Doloprin or Doloprin with Codeine Momentum Tolectin  Asperbuf Duoprin Mono-gesic Trendar  Aspergum Duradyne Motrin or Motrin IB Triminicin  Aspirin plain, buffered or  enteric coated Durasal Myochrisine Trigesic  Aspirin Suppositories Easprin Nalfon Trillsate  Aspirin with Codeine Ecotrin Regular or Extra Strength Naprosyn Uracel  Atromid-S Efficin Naproxen Ursinus  Auranofin Capsules Elmiron Neocylate Vanquish  Axotal Emagrin Norgesic Verin  Azathioprine Empirin or Empirin with Codeine Normiflo Vitamin E  Azolid Emprazil Nuprin Voltaren  Bayer Aspirin plain, buffered or children's or timed BC Tablets or powders Encaprin Orgaran Warfarin Sodium  Buff-a-Comp Enoxaparin Orudis Zorpin  Buff-a-Comp with Codeine Equegesic Os-Cal-Gesic   Buffaprin Excedrin plain, buffered or Extra Strength Oxalid   Bufferin Arthritis Strength Feldene Oxphenbutazone   Bufferin plain or Extra Strength Feldene Capsules Oxycodone with Aspirin   Bufferin with Codeine Fenoprofen Fenoprofen Pabalate or Pabalate-SF   Buffets II Flogesic Panagesic   Buffinol plain or Extra Strength Florinal or Florinal with Codeine Panwarfarin   Buf-Tabs Flurbiprofen Penicillamine   Butalbital Compound Four-way cold tablets Penicillin   Butazolidin Fragmin Pepto-Bismol   Carbenicillin Geminisyn Percodan   Carna Arthritis Reliever Geopen Persantine   Carprofen Gold's salt Persistin   Chloramphenicol Goody's Phenylbutazone   Chloromycetin Haltrain Piroxlcam   Clmetidine heparin Plaquenil   Cllnoril Hyco-pap Ponstel   Clofibrate Hydroxy chloroquine Propoxyphen         Before stopping any of these medications, be sure to consult the  physician who ordered them.  Some, such as Coumadin (Warfarin) are ordered to prevent or treat serious conditions such as "deep thrombosis", "pumonary embolisms", and other heart problems.  The amount of time that you may need off of the medication may also vary with the medication and the reason for which you were taking it.  If you are taking any of these medications, please make sure you notify your pain physician before you undergo any procedures.

## 2016-11-24 ENCOUNTER — Telehealth: Payer: Self-pay | Admitting: *Deleted

## 2016-11-24 NOTE — Telephone Encounter (Signed)
Denies post procedure complications. 

## 2016-11-25 ENCOUNTER — Emergency Department (HOSPITAL_COMMUNITY): Payer: Medicaid Other

## 2016-11-25 ENCOUNTER — Encounter (HOSPITAL_COMMUNITY): Payer: Self-pay | Admitting: Emergency Medicine

## 2016-11-25 ENCOUNTER — Emergency Department (HOSPITAL_COMMUNITY)
Admission: EM | Admit: 2016-11-25 | Discharge: 2016-11-25 | Disposition: A | Discharge status: Home / Self Care | Payer: Medicaid Other | Attending: Emergency Medicine | Admitting: Emergency Medicine

## 2016-11-25 DIAGNOSIS — Z79899 Other long term (current) drug therapy: Secondary | ICD-10-CM | POA: Diagnosis not present

## 2016-11-25 DIAGNOSIS — J45909 Unspecified asthma, uncomplicated: Secondary | ICD-10-CM | POA: Diagnosis not present

## 2016-11-25 DIAGNOSIS — R197 Diarrhea, unspecified: Secondary | ICD-10-CM | POA: Diagnosis not present

## 2016-11-25 DIAGNOSIS — R112 Nausea with vomiting, unspecified: Secondary | ICD-10-CM | POA: Diagnosis present

## 2016-11-25 HISTORY — DX: Migraine, unspecified, not intractable, without status migrainosus: G43.909

## 2016-11-25 HISTORY — DX: Gastro-esophageal reflux disease without esophagitis: K21.9

## 2016-11-25 HISTORY — DX: Other chronic pain: G89.29

## 2016-11-25 LAB — COMPREHENSIVE METABOLIC PANEL
ALBUMIN: 4.9 g/dL (ref 3.5–5.0)
ALK PHOS: 69 U/L (ref 38–126)
ALT: 25 U/L (ref 14–54)
AST: 16 U/L (ref 15–41)
Anion gap: 9 (ref 5–15)
BUN: 14 mg/dL (ref 6–20)
CO2: 21 mmol/L — AB (ref 22–32)
Calcium: 9.7 mg/dL (ref 8.9–10.3)
Chloride: 108 mmol/L (ref 101–111)
Creatinine, Ser: 0.97 mg/dL (ref 0.44–1.00)
GFR calc Af Amer: >60 mL/min (ref >60)
GFR calc non Af Amer: >60 mL/min (ref >60)
GLUCOSE: 108 mg/dL — AB (ref 65–99)
POTASSIUM: 3.6 mmol/L (ref 3.5–5.1)
SODIUM: 138 mmol/L (ref 135–145)
Total Bilirubin: 0.9 mg/dL (ref 0.3–1.2)
Total Protein: 8.2 g/dL — ABNORMAL HIGH (ref 6.5–8.1)

## 2016-11-25 LAB — LIPASE, BLOOD: Lipase: 21 U/L (ref 11–51)

## 2016-11-25 LAB — URINALYSIS, ROUTINE W REFLEX MICROSCOPIC
BILIRUBIN URINE: NEGATIVE
Glucose, UA: NEGATIVE mg/dL
HGB URINE DIPSTICK: NEGATIVE
KETONES UR: NEGATIVE mg/dL
Nitrite: NEGATIVE
PH: 5 (ref 5.0–8.0)
Protein, ur: 100 mg/dL — AB
Specific Gravity, Urine: 1.025 (ref 1.005–1.030)

## 2016-11-25 LAB — CBC WITH DIFFERENTIAL/PLATELET
Basophils Absolute: 0 10*3/uL (ref 0.0–0.1)
Basophils Relative: 0 %
Eosinophils Absolute: 0 10*3/uL (ref 0.0–0.7)
Eosinophils Relative: 0 %
HEMATOCRIT: 46.9 % — AB (ref 36.0–46.0)
Hemoglobin: 16.2 g/dL — ABNORMAL HIGH (ref 12.0–15.0)
LYMPHS ABS: 1.9 10*3/uL (ref 0.7–4.0)
LYMPHS PCT: 16 %
MCH: 29.8 pg (ref 26.0–34.0)
MCHC: 34.5 g/dL (ref 30.0–36.0)
MCV: 86.2 fL (ref 78.0–100.0)
MONO ABS: 0.8 10*3/uL (ref 0.1–1.0)
MONOS PCT: 6 %
NEUTROS ABS: 9.4 10*3/uL — AB (ref 1.7–7.7)
Neutrophils Relative %: 78 %
Platelets: 243 10*3/uL (ref 150–400)
RBC: 5.44 MIL/uL — ABNORMAL HIGH (ref 3.87–5.11)
RDW: 14 % (ref 11.5–15.5)
WBC: 12 10*3/uL — ABNORMAL HIGH (ref 4.0–10.5)

## 2016-11-25 LAB — PREGNANCY, URINE: Preg Test, Ur: NEGATIVE

## 2016-11-25 MED ORDER — ONDANSETRON 4 MG PO TBDP: 4.0000 mg | ORAL_TABLET | Freq: Three times a day (TID) | ORAL | 0 refills | Status: DC | PRN

## 2016-11-25 MED ORDER — DICYCLOMINE HCL 20 MG PO TABS: 20.0000 mg | ORAL_TABLET | Freq: Four times a day (QID) | ORAL | 0 refills | Status: DC | PRN

## 2016-11-25 MED ORDER — FAMOTIDINE 20 MG PO TABS: 20.0000 mg | ORAL_TABLET | Freq: Two times a day (BID) | ORAL | 0 refills | Status: DC

## 2016-11-25 MED ORDER — DICYCLOMINE HCL 10 MG/ML IM SOLN
20.0000 mg | Freq: Once | INTRAMUSCULAR | Status: AC
  Administered 2016-11-25: 20 mg via INTRAMUSCULAR
  Filled 2016-11-25: qty 2

## 2016-11-25 MED ORDER — FAMOTIDINE IN NACL 20-0.9 MG/50ML-% IV SOLN
20.0000 mg | Freq: Once | INTRAVENOUS | Status: AC
  Administered 2016-11-25: 20 mg via INTRAVENOUS
  Filled 2016-11-25: qty 50

## 2016-11-25 MED ORDER — GI COCKTAIL ~~LOC~~
30.0000 mL | Freq: Once | ORAL | Status: DC
  Filled 2016-11-25: qty 30

## 2016-11-25 MED ORDER — SODIUM CHLORIDE 0.9 % IV BOLUS (SEPSIS)
1000.0000 mL | Freq: Once | INTRAVENOUS | Status: AC
  Administered 2016-11-25: 1000 mL via INTRAVENOUS

## 2016-11-25 MED ORDER — ONDANSETRON HCL 4 MG/2ML IJ SOLN
4.0000 mg | INTRAMUSCULAR | Status: DC | PRN
  Administered 2016-11-25: 4 mg via INTRAVENOUS
  Filled 2016-11-25: qty 2

## 2016-11-25 NOTE — ED Triage Notes (Signed)
Pt reports having indigestion yesterday and then this morning began having vomiting and diarrhea.

## 2016-11-25 NOTE — Discharge Instructions (Signed)
Take the prescriptions as directed.  Increase your fluid intake (ie:  Gatoraide) for the next few days. Avoid full strength juices, as well as milk and milk products until your diarrhea has resolved. Eat a bland diet, avoiding greasy, fatty, fried foods, as well as spicy and acidic foods or beverages.  Avoid eating within the hour or 2 before going to bed or laying down.  Also avoid teas, colas, coffee, chocolate, pepermint and spearment.  May also take over the counter maalox/mylanta, as directed on packaging, as needed for discomfort.  Call your regular medical doctor today to schedule a follow up appointment this week.  Return to the Emergency Department immediately if not improving (or even worsening) despite taking the medicines as prescribed, any black or bloody stool or vomit, if you develop a fever over "101," or for any other concerns.

## 2016-11-25 NOTE — ED Notes (Signed)
Patient given sprite to drink. 
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
## 2016-11-30 ENCOUNTER — Ambulatory Visit: Payer: Medicaid Other | Attending: Pain Medicine

## 2016-12-22 ENCOUNTER — Ambulatory Visit: Payer: Medicaid Other | Attending: Pain Medicine | Admitting: Pain Medicine

## 2017-03-02 ENCOUNTER — Emergency Department (HOSPITAL_COMMUNITY)
Admission: EM | Admit: 2017-03-02 | Discharge: 2017-03-02 | Disposition: A | Discharge status: Home / Self Care | Payer: Medicaid Other | Attending: Emergency Medicine | Admitting: Emergency Medicine

## 2017-03-02 ENCOUNTER — Encounter (HOSPITAL_COMMUNITY): Payer: Self-pay | Admitting: Cardiology

## 2017-03-02 DIAGNOSIS — Z79899 Other long term (current) drug therapy: Secondary | ICD-10-CM | POA: Insufficient documentation

## 2017-03-02 DIAGNOSIS — R531 Weakness: Secondary | ICD-10-CM | POA: Insufficient documentation

## 2017-03-02 DIAGNOSIS — R634 Abnormal weight loss: Secondary | ICD-10-CM | POA: Insufficient documentation

## 2017-03-02 DIAGNOSIS — Z87891 Personal history of nicotine dependence: Secondary | ICD-10-CM | POA: Insufficient documentation

## 2017-03-02 DIAGNOSIS — R5383 Other fatigue: Secondary | ICD-10-CM | POA: Insufficient documentation

## 2017-03-02 DIAGNOSIS — L989 Disorder of the skin and subcutaneous tissue, unspecified: Secondary | ICD-10-CM | POA: Insufficient documentation

## 2017-03-02 DIAGNOSIS — J45909 Unspecified asthma, uncomplicated: Secondary | ICD-10-CM | POA: Insufficient documentation

## 2017-03-02 DIAGNOSIS — E119 Type 2 diabetes mellitus without complications: Secondary | ICD-10-CM | POA: Insufficient documentation

## 2017-03-02 LAB — COMPREHENSIVE METABOLIC PANEL
ALK PHOS: 58 U/L (ref 38–126)
ALT: 34 U/L (ref 14–54)
AST: 21 U/L (ref 15–41)
Albumin: 4.2 g/dL (ref 3.5–5.0)
Anion gap: 7 (ref 5–15)
BUN: 9 mg/dL (ref 6–20)
CALCIUM: 8.9 mg/dL (ref 8.9–10.3)
CHLORIDE: 109 mmol/L (ref 101–111)
CO2: 24 mmol/L (ref 22–32)
CREATININE: 0.83 mg/dL (ref 0.44–1.00)
GFR calc non Af Amer: >60 mL/min (ref >60)
GLUCOSE: 85 mg/dL (ref 65–99)
Potassium: 3.5 mmol/L (ref 3.5–5.1)
Sodium: 140 mmol/L (ref 135–145)
Total Bilirubin: 1.1 mg/dL (ref 0.3–1.2)
Total Protein: 7.2 g/dL (ref 6.5–8.1)

## 2017-03-02 LAB — URINALYSIS, ROUTINE W REFLEX MICROSCOPIC
Bacteria, UA: NONE SEEN
Bilirubin Urine: NEGATIVE
GLUCOSE, UA: NEGATIVE mg/dL
KETONES UR: NEGATIVE mg/dL
Leukocytes, UA: NEGATIVE
NITRITE: NEGATIVE
Protein, ur: NEGATIVE mg/dL
SPECIFIC GRAVITY, URINE: 1.019 (ref 1.005–1.030)
pH: 6 (ref 5.0–8.0)

## 2017-03-02 LAB — CBC WITH DIFFERENTIAL/PLATELET
BASOS PCT: 0 %
Basophils Absolute: 0 10*3/uL (ref 0.0–0.1)
EOS ABS: 0.1 10*3/uL (ref 0.0–0.7)
EOS PCT: 1 %
HCT: 42.3 % (ref 36.0–46.0)
HEMOGLOBIN: 14.5 g/dL (ref 12.0–15.0)
LYMPHS ABS: 2.4 10*3/uL (ref 0.7–4.0)
Lymphocytes Relative: 36 %
MCH: 29.7 pg (ref 26.0–34.0)
MCHC: 34.3 g/dL (ref 30.0–36.0)
MCV: 86.5 fL (ref 78.0–100.0)
MONO ABS: 0.4 10*3/uL (ref 0.1–1.0)
MONOS PCT: 6 %
Neutro Abs: 3.8 10*3/uL (ref 1.7–7.7)
Neutrophils Relative %: 57 %
Platelets: 258 10*3/uL (ref 150–400)
RBC: 4.89 MIL/uL (ref 3.87–5.11)
RDW: 13.3 % (ref 11.5–15.5)
WBC: 6.8 10*3/uL (ref 4.0–10.5)

## 2017-03-02 LAB — T4, FREE: Free T4: 0.98 ng/dL (ref 0.61–1.12)

## 2017-03-02 LAB — PREGNANCY, URINE: Preg Test, Ur: NEGATIVE

## 2017-03-02 LAB — TSH: TSH: 0.999 u[IU]/mL (ref 0.350–4.500)

## 2017-03-02 NOTE — Discharge Instructions (Signed)
As discussed, your evaluation today has been largely reassuring.  But, it is important that you monitor your condition carefully, and do not hesitate to return to the ED if you develop new, or concerning changes in your condition. ? ?Otherwise, please follow-up with your physician for appropriate ongoing care. ? ?

## 2017-03-02 NOTE — ED Triage Notes (Signed)
Lump to right lower leg times one month.  Generalized joint pain for 1-2 months.

## 2017-03-02 NOTE — ED Notes (Signed)
Pt alert & oriented x4, stable gait. Patient given discharge instructions, paperwork & prescription(s). Patient  instructed to stop at the registration desk to finish any additional paperwork. Patient verbalized understanding. Pt left department w/ no further questions. 
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
## 2017-03-11 ENCOUNTER — Encounter: Payer: Self-pay | Admitting: Emergency Medicine

## 2017-03-11 DIAGNOSIS — G8929 Other chronic pain: Secondary | ICD-10-CM | POA: Insufficient documentation

## 2017-03-11 DIAGNOSIS — Z87891 Personal history of nicotine dependence: Secondary | ICD-10-CM | POA: Insufficient documentation

## 2017-03-11 DIAGNOSIS — J45909 Unspecified asthma, uncomplicated: Secondary | ICD-10-CM | POA: Insufficient documentation

## 2017-03-11 DIAGNOSIS — E119 Type 2 diabetes mellitus without complications: Secondary | ICD-10-CM | POA: Insufficient documentation

## 2017-03-11 DIAGNOSIS — Z79899 Other long term (current) drug therapy: Secondary | ICD-10-CM | POA: Insufficient documentation

## 2017-03-11 DIAGNOSIS — K644 Residual hemorrhoidal skin tags: Secondary | ICD-10-CM | POA: Insufficient documentation

## 2017-03-11 LAB — COMPREHENSIVE METABOLIC PANEL
ALBUMIN: 4.2 g/dL (ref 3.5–5.0)
ALK PHOS: 59 U/L (ref 38–126)
ALT: 25 U/L (ref 14–54)
ANION GAP: 8 (ref 5–15)
AST: 19 U/L (ref 15–41)
BILIRUBIN TOTAL: 1.3 mg/dL — AB (ref 0.3–1.2)
BUN: 10 mg/dL (ref 6–20)
CALCIUM: 9.2 mg/dL (ref 8.9–10.3)
CO2: 23 mmol/L (ref 22–32)
Chloride: 108 mmol/L (ref 101–111)
Creatinine, Ser: 0.85 mg/dL (ref 0.44–1.00)
GFR calc Af Amer: >60 mL/min (ref >60)
GLUCOSE: 105 mg/dL — AB (ref 65–99)
Potassium: 3.4 mmol/L — ABNORMAL LOW (ref 3.5–5.1)
Sodium: 139 mmol/L (ref 135–145)
TOTAL PROTEIN: 6.9 g/dL (ref 6.5–8.1)

## 2017-03-11 LAB — CBC
HCT: 44.6 % (ref 35.0–47.0)
HEMOGLOBIN: 15 g/dL (ref 12.0–16.0)
MCH: 29.3 pg (ref 26.0–34.0)
MCHC: 33.7 g/dL (ref 32.0–36.0)
MCV: 86.8 fL (ref 80.0–100.0)
Platelets: 244 10*3/uL (ref 150–440)
RBC: 5.14 MIL/uL (ref 3.80–5.20)
RDW: 13.7 % (ref 11.5–14.5)
WBC: 9.6 10*3/uL (ref 3.6–11.0)

## 2017-03-11 LAB — TYPE AND SCREEN
ABO/RH(D): O POS
ANTIBODY SCREEN: NEGATIVE

## 2017-03-11 NOTE — ED Triage Notes (Signed)
Pt comes into the ED via POV c/o rectal bleeding and right sided abdominal pain that started today.  Denies any chest pain, shortness of breath, or vomiting.  Patient states she has been lightheaded and has had nausea.  Patient ambulatory to triage, has good color with skin being warm and pink, and even and unlabored respirations.

## 2017-03-12 ENCOUNTER — Emergency Department
Admission: EM | Admit: 2017-03-12 | Discharge: 2017-03-12 | Disposition: A | Discharge status: Home / Self Care | Payer: Self-pay | Attending: Emergency Medicine | Admitting: Emergency Medicine

## 2017-03-12 DIAGNOSIS — K644 Residual hemorrhoidal skin tags: Secondary | ICD-10-CM

## 2017-03-12 NOTE — ED Notes (Signed)
ED Provider at bedside. 
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
## 2017-03-14 ENCOUNTER — Emergency Department (HOSPITAL_COMMUNITY): Payer: Self-pay

## 2017-03-14 ENCOUNTER — Encounter (HOSPITAL_COMMUNITY): Payer: Self-pay | Admitting: Emergency Medicine

## 2017-03-14 ENCOUNTER — Emergency Department (HOSPITAL_COMMUNITY)
Admission: EM | Admit: 2017-03-14 | Discharge: 2017-03-15 | Disposition: A | Discharge status: Home / Self Care | Payer: Self-pay | Attending: Emergency Medicine | Admitting: Emergency Medicine

## 2017-03-14 DIAGNOSIS — R102 Pelvic and perineal pain: Secondary | ICD-10-CM | POA: Insufficient documentation

## 2017-03-14 DIAGNOSIS — E119 Type 2 diabetes mellitus without complications: Secondary | ICD-10-CM | POA: Insufficient documentation

## 2017-03-14 DIAGNOSIS — J45909 Unspecified asthma, uncomplicated: Secondary | ICD-10-CM | POA: Insufficient documentation

## 2017-03-14 DIAGNOSIS — Z79899 Other long term (current) drug therapy: Secondary | ICD-10-CM | POA: Insufficient documentation

## 2017-03-14 DIAGNOSIS — Z87891 Personal history of nicotine dependence: Secondary | ICD-10-CM | POA: Insufficient documentation

## 2017-03-14 DIAGNOSIS — R1031 Right lower quadrant pain: Secondary | ICD-10-CM | POA: Insufficient documentation

## 2017-03-14 LAB — CBC WITH DIFFERENTIAL/PLATELET
Basophils Absolute: 0 10*3/uL (ref 0.0–0.1)
Basophils Relative: 1 %
EOS PCT: 1 %
Eosinophils Absolute: 0.1 10*3/uL (ref 0.0–0.7)
HEMATOCRIT: 41.4 % (ref 36.0–46.0)
HEMOGLOBIN: 14.4 g/dL (ref 12.0–15.0)
LYMPHS ABS: 2.8 10*3/uL (ref 0.7–4.0)
LYMPHS PCT: 40 %
MCH: 29.9 pg (ref 26.0–34.0)
MCHC: 34.8 g/dL (ref 30.0–36.0)
MCV: 85.9 fL (ref 78.0–100.0)
Monocytes Absolute: 0.4 10*3/uL (ref 0.1–1.0)
Monocytes Relative: 6 %
NEUTROS ABS: 3.8 10*3/uL (ref 1.7–7.7)
NEUTROS PCT: 52 %
Platelets: 244 10*3/uL (ref 150–400)
RBC: 4.82 MIL/uL (ref 3.87–5.11)
RDW: 13 % (ref 11.5–15.5)
WBC: 7.2 10*3/uL (ref 4.0–10.5)

## 2017-03-14 LAB — BASIC METABOLIC PANEL
ANION GAP: 8 (ref 5–15)
BUN: 9 mg/dL (ref 6–20)
CHLORIDE: 109 mmol/L (ref 101–111)
CO2: 23 mmol/L (ref 22–32)
Calcium: 9.4 mg/dL (ref 8.9–10.3)
Creatinine, Ser: 0.85 mg/dL (ref 0.44–1.00)
GFR calc Af Amer: >60 mL/min (ref >60)
GFR calc non Af Amer: >60 mL/min (ref >60)
GLUCOSE: 97 mg/dL (ref 65–99)
POTASSIUM: 3.3 mmol/L — AB (ref 3.5–5.1)
Sodium: 140 mmol/L (ref 135–145)

## 2017-03-14 LAB — URINALYSIS, ROUTINE W REFLEX MICROSCOPIC
Bilirubin Urine: NEGATIVE
Glucose, UA: NEGATIVE mg/dL
Hgb urine dipstick: NEGATIVE
KETONES UR: NEGATIVE mg/dL
Nitrite: NEGATIVE
PROTEIN: NEGATIVE mg/dL
Specific Gravity, Urine: 1.013 (ref 1.005–1.030)
pH: 7 (ref 5.0–8.0)

## 2017-03-14 LAB — I-STAT BETA HCG BLOOD, ED (MC, WL, AP ONLY): I-stat hCG, quantitative: <5 m[IU]/mL (ref <5)

## 2017-03-14 MED ORDER — IOPAMIDOL (ISOVUE-300) INJECTION 61%
100.0000 mL | Freq: Once | INTRAVENOUS | Status: AC | PRN
  Administered 2017-03-14: 100 mL via INTRAVENOUS

## 2017-03-14 NOTE — ED Notes (Signed)
Awaiting CT

## 2017-03-14 NOTE — ED Notes (Addendum)
Patient transported to CT 

## 2017-03-14 NOTE — ED Notes (Signed)
Pt returned from ct,  

## 2017-03-14 NOTE — ED Notes (Signed)
HCG less than 5
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
## 2017-03-14 NOTE — ED Triage Notes (Signed)
Pt states that she has been having pelvic and abdominal pain for 2 days with a yellowish discharge

## 2017-03-15 NOTE — ED Provider Notes (Signed)
Pt received at sign out with CT A/P pending. Pt informed of her reassuring workup. Pt refuses to be tx for PID, stating she will f/u with her own doctor on Tuesday and "just wants to leave right now." Risks/benefits explained, pt still refuses. Pt makes her own medical decisions. Dx and testing d/w pt.  Questions answered.  Verb understanding, agreeable to d/c home with outpt f/u.    Results for orders placed or performed during the hospital encounter of 03/14/17  CBC with Differential/Platelet  Result Value Ref Range   WBC 7.2 4.0 - 10.5 K/uL   RBC 4.82 3.87 - 5.11 MIL/uL   Hemoglobin 14.4 12.0 - 15.0 g/dL   HCT 82.941.4 56.236.0 - 13.046.0 %   MCV 85.9 78.0 - 100.0 fL   MCH 29.9 26.0 - 34.0 pg   MCHC 34.8 30.0 - 36.0 g/dL   RDW 86.513.0 78.411.5 - 69.615.5 %   Platelets 244 150 - 400 K/uL   Neutrophils Relative % 52 %   Neutro Abs 3.8 1.7 - 7.7 K/uL   Lymphocytes Relative 40 %   Lymphs Abs 2.8 0.7 - 4.0 K/uL   Monocytes Relative 6 %   Monocytes Absolute 0.4 0.1 - 1.0 K/uL   Eosinophils Relative 1 %   Eosinophils Absolute 0.1 0.0 - 0.7 K/uL   Basophils Relative 1 %   Basophils Absolute 0.0 0.0 - 0.1 K/uL  Basic metabolic panel  Result Value Ref Range   Sodium 140 135 - 145 mmol/L   Potassium 3.3 (L) 3.5 - 5.1 mmol/L   Chloride 109 101 - 111 mmol/L   CO2 23 22 - 32 mmol/L   Glucose, Bld 97 65 - 99 mg/dL   BUN 9 6 - 20 mg/dL   Creatinine, Ser 2.950.85 0.44 - 1.00 mg/dL   Calcium 9.4 8.9 - 28.410.3 mg/dL   GFR calc non Af Amer >60 >60 mL/min   GFR calc Af Amer >60 >60 mL/min   Anion gap 8 5 - 15  Urinalysis, Routine w reflex microscopic  Result Value Ref Range   Color, Urine YELLOW YELLOW   APPearance CLOUDY (A) CLEAR   Specific Gravity, Urine 1.013 1.005 - 1.030   pH 7.0 5.0 - 8.0   Glucose, UA NEGATIVE NEGATIVE mg/dL   Hgb urine dipstick NEGATIVE NEGATIVE   Bilirubin Urine NEGATIVE NEGATIVE   Ketones, ur NEGATIVE NEGATIVE mg/dL   Protein, ur NEGATIVE NEGATIVE mg/dL   Nitrite NEGATIVE NEGATIVE   Leukocytes, UA MODERATE (A) NEGATIVE   RBC / HPF 0-5 0 - 5 RBC/hpf   WBC, UA 0-5 0 - 5 WBC/hpf   Bacteria, UA RARE (A) NONE SEEN   Squamous Epithelial / LPF 0-5 (A) NONE SEEN  I-Stat Beta hCG blood, ED ([facility], [facility], [facility] only)  Result Value Ref Range   I-stat hCG, quantitative <5.0 <5 mIU/mL   Comment 3           Ct Abdomen Pelvis W Contrast Result Date: 03/14/2017 CLINICAL DATA:  Lower abdominal pain for 2 days. Gas pain. Nausea and cramping. EXAM: CT ABDOMEN AND PELVIS WITH CONTRAST TECHNIQUE: Multidetector CT imaging of the abdomen and pelvis was performed using the standard protocol following bolus administration of intravenous contrast. CONTRAST:  100mL ISOVUE-300 IOPAMIDOL (ISOVUE-300) INJECTION 61% COMPARISON:  None. FINDINGS: Lower chest: The lung bases are clear. Hepatobiliary: No focal hepatic lesion. Clips in the gallbladder fossa postcholecystectomy. No biliary dilatation. Pancreas: No ductal dilatation or inflammation. Spleen: Upper normal in size spanning 13.8 cm. Adrenals/Urinary  Tract: Normal adrenal glands. No hydronephrosis or perinephric edema. Nonobstructing 3 mm stone in the lower right kidney. Nonobstructing 4 mm stone in the lower left kidney. No ureteral calculi. Urinary bladder is nondistended. No bladder wall thickening or stone. Stomach/Bowel: Stomach is within normal limits. Appendix appears normal. No evidence of bowel wall thickening, distention, or inflammatory changes. Vascular/Lymphatic: No significant vascular findings are present. No enlarged abdominal or pelvic lymph nodes. Reproductive: Probable collapsing corpus luteal cyst in the left ovary. Right ovary is normal in size. Normal CT appearance of the uterus. Small volume of pelvic free fluid is physiologic. Other: No upper abdominal ascites. No free air or intra-abdominal abscess. Musculoskeletal: There are no acute or suspicious osseous abnormalities. IMPRESSION: 1. Probable collapsing corpus luteal cyst in the left ovary,  physiologic but may be cause of lower abdominal pain. 2. Bilateral nonobstructing renal calculi. 3. No bowel inflammation or obstruction. Electronically Signed   By: Rubye Oaks M.D.   On: 03/14/2017 23:45      Samuel Jester, DO 03/15/17 0015

## 2017-03-15 NOTE — Discharge Instructions (Signed)
Take over the counter tylenol and ibuprofen, as directed on packaging, as needed for discomfort. Call your regular medical or OB/GYN doctor on Tuesday to schedule a follow up appointment this week.  Return to the Emergency Department immediately sooner if worsening.

## 2017-03-17 ENCOUNTER — Ambulatory Visit: Payer: Self-pay | Admitting: Gastroenterology

## 2017-03-18 LAB — GC/CHLAMYDIA PROBE AMP (~~LOC~~) NOT AT ARMC
Chlamydia: NEGATIVE
NEISSERIA GONORRHEA: NEGATIVE

## 2017-03-29 ENCOUNTER — Ambulatory Visit: Payer: Self-pay | Admitting: Physician Assistant

## 2017-04-12 ENCOUNTER — Encounter (HOSPITAL_COMMUNITY): Payer: Self-pay | Admitting: Emergency Medicine

## 2017-04-12 ENCOUNTER — Emergency Department (HOSPITAL_COMMUNITY): Payer: Commercial Managed Care - PPO

## 2017-04-12 ENCOUNTER — Emergency Department (HOSPITAL_COMMUNITY)
Admission: EM | Admit: 2017-04-12 | Discharge: 2017-04-12 | Disposition: A | Discharge status: Home / Self Care | Payer: Commercial Managed Care - PPO | Attending: Emergency Medicine | Admitting: Emergency Medicine

## 2017-04-12 DIAGNOSIS — R109 Unspecified abdominal pain: Secondary | ICD-10-CM | POA: Diagnosis present

## 2017-04-12 DIAGNOSIS — Z87891 Personal history of nicotine dependence: Secondary | ICD-10-CM | POA: Diagnosis not present

## 2017-04-12 DIAGNOSIS — E119 Type 2 diabetes mellitus without complications: Secondary | ICD-10-CM | POA: Diagnosis not present

## 2017-04-12 DIAGNOSIS — K529 Noninfective gastroenteritis and colitis, unspecified: Secondary | ICD-10-CM | POA: Insufficient documentation

## 2017-04-12 DIAGNOSIS — Z79899 Other long term (current) drug therapy: Secondary | ICD-10-CM | POA: Insufficient documentation

## 2017-04-12 DIAGNOSIS — J45909 Unspecified asthma, uncomplicated: Secondary | ICD-10-CM | POA: Insufficient documentation

## 2017-04-12 DIAGNOSIS — K6389 Other specified diseases of intestine: Secondary | ICD-10-CM

## 2017-04-12 LAB — COMPREHENSIVE METABOLIC PANEL
ALK PHOS: 62 U/L (ref 38–126)
ALT: 21 U/L (ref 14–54)
AST: 18 U/L (ref 15–41)
Albumin: 4.2 g/dL (ref 3.5–5.0)
Anion gap: 8 (ref 5–15)
BUN: 8 mg/dL (ref 6–20)
CALCIUM: 9.2 mg/dL (ref 8.9–10.3)
CO2: 23 mmol/L (ref 22–32)
CREATININE: 0.79 mg/dL (ref 0.44–1.00)
Chloride: 105 mmol/L (ref 101–111)
GFR calc non Af Amer: >60 mL/min (ref >60)
Glucose, Bld: 98 mg/dL (ref 65–99)
Potassium: 3.9 mmol/L (ref 3.5–5.1)
SODIUM: 136 mmol/L (ref 135–145)
Total Bilirubin: 0.7 mg/dL (ref 0.3–1.2)
Total Protein: 7.3 g/dL (ref 6.5–8.1)

## 2017-04-12 LAB — CBC
HCT: 42.1 % (ref 36.0–46.0)
Hemoglobin: 14.3 g/dL (ref 12.0–15.0)
MCH: 29.4 pg (ref 26.0–34.0)
MCHC: 34 g/dL (ref 30.0–36.0)
MCV: 86.6 fL (ref 78.0–100.0)
Platelets: 214 10*3/uL (ref 150–400)
RBC: 4.86 MIL/uL (ref 3.87–5.11)
RDW: 13.1 % (ref 11.5–15.5)
WBC: 6.2 10*3/uL (ref 4.0–10.5)

## 2017-04-12 LAB — URINALYSIS, ROUTINE W REFLEX MICROSCOPIC
Bilirubin Urine: NEGATIVE
GLUCOSE, UA: NEGATIVE mg/dL
Hgb urine dipstick: NEGATIVE
Ketones, ur: NEGATIVE mg/dL
Nitrite: NEGATIVE
PH: 5 (ref 5.0–8.0)
Protein, ur: NEGATIVE mg/dL
Specific Gravity, Urine: 1.015 (ref 1.005–1.030)

## 2017-04-12 LAB — PREGNANCY, URINE: Preg Test, Ur: NEGATIVE

## 2017-04-12 LAB — LIPASE, BLOOD: Lipase: 30 U/L (ref 11–51)

## 2017-04-12 MED ORDER — SODIUM CHLORIDE 0.9 % IV BOLUS (SEPSIS)
1000.0000 mL | Freq: Once | INTRAVENOUS | Status: AC
  Administered 2017-04-12: 1000 mL via INTRAVENOUS

## 2017-04-12 MED ORDER — HYDROMORPHONE HCL 1 MG/ML IJ SOLN
1.0000 mg | Freq: Once | INTRAMUSCULAR | Status: AC
  Administered 2017-04-12: 1 mg via INTRAVENOUS

## 2017-04-12 MED ORDER — DICYCLOMINE HCL 10 MG PO CAPS
10.0000 mg | ORAL_CAPSULE | Freq: Once | ORAL | Status: AC
  Administered 2017-04-12: 10 mg via ORAL
  Filled 2017-04-12: qty 1

## 2017-04-12 MED ORDER — OXYCODONE-ACETAMINOPHEN 5-325 MG PO TABS: 1.0000 | ORAL_TABLET | Freq: Four times a day (QID) | ORAL | 0 refills | Status: DC | PRN

## 2017-04-12 MED ORDER — IOPAMIDOL (ISOVUE-300) INJECTION 61%
100.0000 mL | Freq: Once | INTRAVENOUS | Status: AC | PRN
  Administered 2017-04-12: 100 mL via INTRAVENOUS

## 2017-04-12 MED ORDER — KETOROLAC TROMETHAMINE 60 MG/2ML IM SOLN
60.0000 mg | Freq: Once | INTRAMUSCULAR | Status: DC
  Filled 2017-04-12: qty 2

## 2017-04-12 MED ORDER — IBUPROFEN 800 MG PO TABS: 800.0000 mg | ORAL_TABLET | Freq: Three times a day (TID) | ORAL | 0 refills | Status: DC

## 2017-04-12 MED ORDER — HYDROMORPHONE HCL 1 MG/ML IJ SOLN
0.5000 mg | Freq: Once | INTRAMUSCULAR | Status: DC
  Filled 2017-04-12: qty 1

## 2017-04-12 MED ORDER — CIPROFLOXACIN HCL 500 MG PO TABS: 500.0000 mg | ORAL_TABLET | Freq: Two times a day (BID) | ORAL | 0 refills | Status: DC

## 2017-04-12 NOTE — ED Triage Notes (Signed)
Pt here for abd pain. Denies n/v/d. Urinary frequency. C/o clear vag dc. Nad. Sx chronic. These sx x 3 days ago
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
## 2017-05-16 ENCOUNTER — Emergency Department (HOSPITAL_COMMUNITY)
Admission: EM | Admit: 2017-05-16 | Discharge: 2017-05-16 | Disposition: A | Discharge status: Home / Self Care | Payer: Commercial Managed Care - PPO | Attending: Emergency Medicine | Admitting: Emergency Medicine

## 2017-05-16 ENCOUNTER — Other Ambulatory Visit: Payer: Self-pay

## 2017-05-16 ENCOUNTER — Encounter (HOSPITAL_COMMUNITY): Payer: Self-pay

## 2017-05-16 DIAGNOSIS — R0981 Nasal congestion: Secondary | ICD-10-CM | POA: Diagnosis not present

## 2017-05-16 DIAGNOSIS — Z87891 Personal history of nicotine dependence: Secondary | ICD-10-CM | POA: Insufficient documentation

## 2017-05-16 DIAGNOSIS — J029 Acute pharyngitis, unspecified: Secondary | ICD-10-CM | POA: Diagnosis not present

## 2017-05-16 DIAGNOSIS — Z79899 Other long term (current) drug therapy: Secondary | ICD-10-CM | POA: Insufficient documentation

## 2017-05-16 DIAGNOSIS — J45909 Unspecified asthma, uncomplicated: Secondary | ICD-10-CM | POA: Diagnosis not present

## 2017-05-16 DIAGNOSIS — R300 Dysuria: Secondary | ICD-10-CM | POA: Diagnosis not present

## 2017-05-16 DIAGNOSIS — R05 Cough: Secondary | ICD-10-CM | POA: Diagnosis not present

## 2017-05-16 LAB — URINALYSIS, ROUTINE W REFLEX MICROSCOPIC
Bilirubin Urine: NEGATIVE
Glucose, UA: NEGATIVE mg/dL
Hgb urine dipstick: NEGATIVE
Ketones, ur: NEGATIVE mg/dL
Leukocytes, UA: NEGATIVE
NITRITE: NEGATIVE
PH: 5 (ref 5.0–8.0)
Protein, ur: NEGATIVE mg/dL
SPECIFIC GRAVITY, URINE: 1.019 (ref 1.005–1.030)

## 2017-05-16 LAB — PREGNANCY, URINE: PREG TEST UR: NEGATIVE

## 2017-05-16 LAB — RAPID STREP SCREEN (MED CTR MEBANE ONLY): STREPTOCOCCUS, GROUP A SCREEN (DIRECT): NEGATIVE

## 2017-05-16 NOTE — Discharge Instructions (Signed)
Please use frequent saline gargles, tylenol for pain. Recheck with your doctor this week.

## 2017-05-16 NOTE — ED Triage Notes (Signed)
Patient reports sore throat that started today. Also complains of urinary frequency. Denies nausea, vomiting, diarrhea or abdominal pain.   Patient appears to have delayed responses when answering questions. States she feels like she cant get her thoughts together. Has not been on anti-anxiety mediation in a while per patient.
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
## 2017-05-19 LAB — CULTURE, GROUP A STREP (THRC)

## 2017-05-24 ENCOUNTER — Other Ambulatory Visit: Payer: Self-pay

## 2017-05-24 ENCOUNTER — Encounter: Payer: Self-pay | Admitting: Nurse Practitioner

## 2017-05-24 ENCOUNTER — Ambulatory Visit: Payer: Commercial Managed Care - PPO | Attending: Nurse Practitioner | Admitting: Nurse Practitioner

## 2017-05-24 VITALS — BP 116/64 | HR 86 | Temp 98.6°F | Resp 16 | Ht 63.0 in | Wt 175.0 lb

## 2017-05-24 DIAGNOSIS — Z79891 Long term (current) use of opiate analgesic: Secondary | ICD-10-CM

## 2017-05-24 DIAGNOSIS — M25512 Pain in left shoulder: Secondary | ICD-10-CM | POA: Diagnosis not present

## 2017-05-24 DIAGNOSIS — G894 Chronic pain syndrome: Secondary | ICD-10-CM

## 2017-05-24 DIAGNOSIS — J45909 Unspecified asthma, uncomplicated: Secondary | ICD-10-CM | POA: Insufficient documentation

## 2017-05-24 DIAGNOSIS — K589 Irritable bowel syndrome without diarrhea: Secondary | ICD-10-CM | POA: Insufficient documentation

## 2017-05-24 DIAGNOSIS — M25511 Pain in right shoulder: Secondary | ICD-10-CM | POA: Diagnosis not present

## 2017-05-24 DIAGNOSIS — Z880 Allergy status to penicillin: Secondary | ICD-10-CM | POA: Insufficient documentation

## 2017-05-24 DIAGNOSIS — Z87891 Personal history of nicotine dependence: Secondary | ICD-10-CM | POA: Diagnosis not present

## 2017-05-24 DIAGNOSIS — E119 Type 2 diabetes mellitus without complications: Secondary | ICD-10-CM | POA: Insufficient documentation

## 2017-05-24 DIAGNOSIS — F329 Major depressive disorder, single episode, unspecified: Secondary | ICD-10-CM | POA: Insufficient documentation

## 2017-05-24 DIAGNOSIS — Z5181 Encounter for therapeutic drug level monitoring: Secondary | ICD-10-CM | POA: Insufficient documentation

## 2017-05-24 DIAGNOSIS — Z79899 Other long term (current) drug therapy: Secondary | ICD-10-CM | POA: Insufficient documentation

## 2017-05-24 DIAGNOSIS — M5442 Lumbago with sciatica, left side: Secondary | ICD-10-CM

## 2017-05-24 DIAGNOSIS — F419 Anxiety disorder, unspecified: Secondary | ICD-10-CM | POA: Insufficient documentation

## 2017-05-24 DIAGNOSIS — G8929 Other chronic pain: Secondary | ICD-10-CM

## 2017-05-24 DIAGNOSIS — M549 Dorsalgia, unspecified: Secondary | ICD-10-CM | POA: Diagnosis not present

## 2017-05-24 DIAGNOSIS — M25552 Pain in left hip: Secondary | ICD-10-CM | POA: Diagnosis not present

## 2017-05-24 DIAGNOSIS — K219 Gastro-esophageal reflux disease without esophagitis: Secondary | ICD-10-CM | POA: Insufficient documentation

## 2017-05-24 DIAGNOSIS — M5441 Lumbago with sciatica, right side: Secondary | ICD-10-CM

## 2017-05-24 DIAGNOSIS — M5412 Radiculopathy, cervical region: Secondary | ICD-10-CM | POA: Insufficient documentation

## 2017-05-24 DIAGNOSIS — M25551 Pain in right hip: Secondary | ICD-10-CM | POA: Insufficient documentation

## 2017-05-24 NOTE — Progress Notes (Signed)
Safety precautions to be maintained throughout the outpatient stay will include: orient to surroundings, keep bed in low position, maintain call bell within reach at all times, provide assistance with transfer out of bed and ambulation.  
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
## 2017-05-24 NOTE — Patient Instructions (Addendum)
Patient to have images done and return to see Dr. Laban EmperorNaveira.   BMI Assessment: Estimated body mass index is 31 kg/m as calculated from the following:   Height as of this encounter: 5\' 3"  (1.6 m).   Weight as of this encounter: 175 lb (79.4 kg).  BMI interpretation table: BMI level Category Range association with higher incidence of chronic pain  <18 kg/m2 Underweight   18.5-24.9 kg/m2 Ideal body weight   25-29.9 kg/m2 Overweight Increased incidence by 20%  30-34.9 kg/m2 Obese (Class I) Increased incidence by 68%  35-39.9 kg/m2 Severe obesity (Class II) Increased incidence by 136%  >40 kg/m2 Extreme obesity (Class III) Increased incidence by 254%   BMI Readings from Last 4 Encounters:  05/24/17 31.00 kg/m  05/16/17 32.24 kg/m  03/14/17 31.71 kg/m  03/11/17 32.24 kg/m   Wt Readings from Last 4 Encounters:  05/24/17 175 lb (79.4 kg)  05/16/17 182 lb (82.6 kg)  03/14/17 179 lb (81.2 kg)  03/11/17 182 lb (82.6 kg)

## 2017-05-28 LAB — COMPLIANCE DRUG ANALYSIS, UR

## 2017-06-05 ENCOUNTER — Encounter (HOSPITAL_COMMUNITY): Payer: Self-pay | Admitting: Emergency Medicine

## 2017-06-05 ENCOUNTER — Other Ambulatory Visit: Payer: Self-pay

## 2017-06-05 ENCOUNTER — Emergency Department (HOSPITAL_COMMUNITY)
Admission: EM | Admit: 2017-06-05 | Discharge: 2017-06-05 | Disposition: A | Discharge status: Home / Self Care | Payer: Commercial Managed Care - PPO | Attending: Emergency Medicine | Admitting: Emergency Medicine

## 2017-06-05 DIAGNOSIS — J45909 Unspecified asthma, uncomplicated: Secondary | ICD-10-CM | POA: Insufficient documentation

## 2017-06-05 DIAGNOSIS — N649 Disorder of breast, unspecified: Secondary | ICD-10-CM | POA: Diagnosis not present

## 2017-06-05 DIAGNOSIS — Z87891 Personal history of nicotine dependence: Secondary | ICD-10-CM | POA: Insufficient documentation

## 2017-06-05 DIAGNOSIS — Z79899 Other long term (current) drug therapy: Secondary | ICD-10-CM | POA: Insufficient documentation

## 2017-06-05 DIAGNOSIS — R600 Localized edema: Secondary | ICD-10-CM | POA: Diagnosis present

## 2017-06-05 MED ORDER — LORAZEPAM 0.5 MG PO TABS: 0.5000 mg | ORAL_TABLET | Freq: Three times a day (TID) | ORAL | 0 refills | Status: DC | PRN
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
## 2017-06-05 NOTE — ED Triage Notes (Signed)
Two days ago noticed lump, indention, redness and pain to RT breast.

## 2017-06-05 NOTE — Discharge Instructions (Signed)
As discussed, it is important that you contact our breast imaging center on Monday to ensure that the next steps are taken for evaluation of your lump.  You should also speak with your primary care physician to keep her in the loop and discuss today's evaluation and the results of this week's studies.  Return here for concerning changes in your condition.

## 2017-06-08 DIAGNOSIS — L309 Dermatitis, unspecified: Secondary | ICD-10-CM | POA: Insufficient documentation
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
## 2017-06-09 ENCOUNTER — Ambulatory Visit: Payer: Commercial Managed Care - PPO | Admitting: Pain Medicine

## 2017-06-15 ENCOUNTER — Ambulatory Visit
Admission: RE | Admit: 2017-06-15 | Discharge: 2017-06-15 | Disposition: A | Discharge status: Home / Self Care | Payer: Commercial Managed Care - PPO | Source: Ambulatory Visit | Attending: Pain Medicine | Admitting: Pain Medicine

## 2017-06-15 DIAGNOSIS — M5442 Lumbago with sciatica, left side: Principal | ICD-10-CM

## 2017-06-15 DIAGNOSIS — M546 Pain in thoracic spine: Secondary | ICD-10-CM | POA: Diagnosis not present

## 2017-06-15 DIAGNOSIS — M25551 Pain in right hip: Secondary | ICD-10-CM | POA: Insufficient documentation

## 2017-06-15 DIAGNOSIS — M47816 Spondylosis without myelopathy or radiculopathy, lumbar region: Secondary | ICD-10-CM

## 2017-06-15 DIAGNOSIS — M5441 Lumbago with sciatica, right side: Principal | ICD-10-CM

## 2017-06-15 DIAGNOSIS — M25552 Pain in left hip: Secondary | ICD-10-CM | POA: Diagnosis not present

## 2017-06-15 DIAGNOSIS — M545 Low back pain: Secondary | ICD-10-CM | POA: Diagnosis present

## 2017-06-15 DIAGNOSIS — G8929 Other chronic pain: Secondary | ICD-10-CM

## 2017-06-15 DIAGNOSIS — M549 Dorsalgia, unspecified: Secondary | ICD-10-CM

## 2017-06-15 NOTE — ED Notes (Signed)
06/15/2017, Pt. Called for information on labs that was drawn. Reviewed information with pt.

## 2017-06-16 ENCOUNTER — Encounter: Payer: Self-pay | Admitting: Pain Medicine

## 2017-06-16 ENCOUNTER — Other Ambulatory Visit: Payer: Self-pay

## 2017-06-16 ENCOUNTER — Ambulatory Visit: Payer: Commercial Managed Care - PPO | Attending: Pain Medicine | Admitting: Pain Medicine

## 2017-06-16 VITALS — BP 107/69 | HR 84 | Temp 98.7°F | Ht 63.0 in | Wt 176.0 lb

## 2017-06-16 DIAGNOSIS — Z880 Allergy status to penicillin: Secondary | ICD-10-CM | POA: Diagnosis not present

## 2017-06-16 DIAGNOSIS — M5441 Lumbago with sciatica, right side: Secondary | ICD-10-CM | POA: Diagnosis not present

## 2017-06-16 DIAGNOSIS — Z9049 Acquired absence of other specified parts of digestive tract: Secondary | ICD-10-CM | POA: Diagnosis not present

## 2017-06-16 DIAGNOSIS — Z888 Allergy status to other drugs, medicaments and biological substances status: Secondary | ICD-10-CM | POA: Insufficient documentation

## 2017-06-16 DIAGNOSIS — J45909 Unspecified asthma, uncomplicated: Secondary | ICD-10-CM | POA: Diagnosis not present

## 2017-06-16 DIAGNOSIS — M797 Fibromyalgia: Secondary | ICD-10-CM | POA: Diagnosis not present

## 2017-06-16 DIAGNOSIS — E538 Deficiency of other specified B group vitamins: Secondary | ICD-10-CM | POA: Insufficient documentation

## 2017-06-16 DIAGNOSIS — M5412 Radiculopathy, cervical region: Secondary | ICD-10-CM | POA: Insufficient documentation

## 2017-06-16 DIAGNOSIS — M542 Cervicalgia: Secondary | ICD-10-CM | POA: Diagnosis not present

## 2017-06-16 DIAGNOSIS — R51 Headache: Secondary | ICD-10-CM | POA: Insufficient documentation

## 2017-06-16 DIAGNOSIS — G8929 Other chronic pain: Secondary | ICD-10-CM | POA: Diagnosis not present

## 2017-06-16 DIAGNOSIS — F329 Major depressive disorder, single episode, unspecified: Secondary | ICD-10-CM | POA: Diagnosis not present

## 2017-06-16 DIAGNOSIS — F419 Anxiety disorder, unspecified: Secondary | ICD-10-CM | POA: Diagnosis not present

## 2017-06-16 DIAGNOSIS — Z9889 Other specified postprocedural states: Secondary | ICD-10-CM | POA: Diagnosis not present

## 2017-06-16 DIAGNOSIS — K589 Irritable bowel syndrome without diarrhea: Secondary | ICD-10-CM | POA: Insufficient documentation

## 2017-06-16 DIAGNOSIS — M47816 Spondylosis without myelopathy or radiculopathy, lumbar region: Secondary | ICD-10-CM | POA: Diagnosis not present

## 2017-06-16 DIAGNOSIS — M546 Pain in thoracic spine: Secondary | ICD-10-CM | POA: Insufficient documentation

## 2017-06-16 DIAGNOSIS — M25552 Pain in left hip: Secondary | ICD-10-CM | POA: Diagnosis not present

## 2017-06-16 DIAGNOSIS — K219 Gastro-esophageal reflux disease without esophagitis: Secondary | ICD-10-CM | POA: Insufficient documentation

## 2017-06-16 DIAGNOSIS — Z818 Family history of other mental and behavioral disorders: Secondary | ICD-10-CM | POA: Insufficient documentation

## 2017-06-16 DIAGNOSIS — M25511 Pain in right shoulder: Secondary | ICD-10-CM | POA: Insufficient documentation

## 2017-06-16 DIAGNOSIS — M7918 Myalgia, other site: Secondary | ICD-10-CM | POA: Diagnosis not present

## 2017-06-16 DIAGNOSIS — M25551 Pain in right hip: Secondary | ICD-10-CM | POA: Diagnosis not present

## 2017-06-16 DIAGNOSIS — Z79899 Other long term (current) drug therapy: Secondary | ICD-10-CM | POA: Insufficient documentation

## 2017-06-16 DIAGNOSIS — Z791 Long term (current) use of non-steroidal anti-inflammatories (NSAID): Secondary | ICD-10-CM | POA: Insufficient documentation

## 2017-06-16 DIAGNOSIS — M5442 Lumbago with sciatica, left side: Secondary | ICD-10-CM | POA: Diagnosis not present

## 2017-06-16 DIAGNOSIS — Z87891 Personal history of nicotine dependence: Secondary | ICD-10-CM | POA: Diagnosis not present

## 2017-06-16 DIAGNOSIS — M549 Dorsalgia, unspecified: Secondary | ICD-10-CM

## 2017-06-16 DIAGNOSIS — Z825 Family history of asthma and other chronic lower respiratory diseases: Secondary | ICD-10-CM | POA: Insufficient documentation

## 2017-06-16 DIAGNOSIS — F1390 Sedative, hypnotic, or anxiolytic use, unspecified, uncomplicated: Secondary | ICD-10-CM | POA: Diagnosis not present

## 2017-06-16 DIAGNOSIS — G894 Chronic pain syndrome: Secondary | ICD-10-CM | POA: Diagnosis present

## 2017-06-16 DIAGNOSIS — M25512 Pain in left shoulder: Secondary | ICD-10-CM | POA: Diagnosis not present

## 2017-06-16 MED ORDER — PREGABALIN 75 MG PO CAPS: 75.0000 mg | ORAL_CAPSULE | Freq: Two times a day (BID) | ORAL | 0 refills | Status: DC

## 2017-06-16 NOTE — Progress Notes (Signed)
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 (R>L)   2. Lumbar facet syndrome (Bilateral) (R>L)   3. Lumbar spondylosis   4. Chronic upper back pain (Primary Source of Pain) (midline)   5. Chronic neck pain (Secondary source of pain) (Bilateral) (L>R)   6. Chronic pain syndrome   7. Musculoskeletal pain   8. Fibromyalgia   9. B12 deficiency   10. Long term prescription benzodiazepine use     Problems updated and reviewed during this visit: Problem  Long Term Prescription Benzodiazepine Use   Plan of Dalton  Pharmacotherapy (Medications Ordered): Meds ordered this encounter  Medications  . pregabalin (LYRICA) 75 MG capsule    Sig: Take 1 capsule (75 mg total) by mouth 2 (two) times daily.    Dispense:  60 capsule    Refill:  0    Do not place this medication, or any other prescription from our practice, on "Automatic Refill". Patient may have  prescription filled one day early if pharmacy is closed on scheduled refill date.  This SmartLink is deprecated. Use AVSMEDLIST instead to display the medication list for a patient. Medications administered today: Analisse Randle had no medications administered during this visit.   Procedure Orders     LUMBAR FACET(MEDIAL BRANCH NERVE BLOCK) MBNB  Lab Orders     Epstein-Barr virus VCA antibody panel     Vitamin B12 Imaging Orders  No imaging studies ordered today   Referral Orders  No referral(s) requested today    Interventional management options: Planned, scheduled, and/or pending:   None at this time.    Considering:   Diagnostic left sided cervical epidural steroid injection  Diagnostic bilateral cervical facet block  Possible bilateral cervical facet radiofrequency ablation  Diagnostic bilateral greater occipital nerve block  Possible bilateral greater occipital nerve radiofrequency ablation  Diagnostic bilateral intra-articular shoulder joint injection  Diagnostic bilateral suprascapular nerve block  Possible bilateral suprascapular nerve radiofrequency ablation  Diagnostic bilateral lumbar facet block  Possible bilateral lumbar facet radiofrequency ablation  Diagnostic bilateral sacroiliac joint block  Possible bilateral sacroiliac joint radiofrequency ablation .  Diagnostic right-sided  L4-5 lumbar epidural steroid injection  Diagnostic bilateral intra-articular hip joint injection  Possible bilateral hip joint radiofrequency ablation    Palliative PRN treatment(s):   Diagnostic bilateral lumbar facet block under fluoroscopic guidance and IV sedation   Provider-requested follow-up: Return in about 2 weeks (around 06/30/2017) for Med-Mgmt (w/ Dionisio David, NP).  Future Appointments  Date Time Provider [place]  06/30/2017  9:15 AM Vevelyn Francois, NP [facility] None   Primary [place] Physician: Allison Dalton Location: [facility] Note by: Allison Cola, MD Date: 06/16/2017; Time: 2:22 PM

## 2017-06-16 NOTE — Progress Notes (Signed)
Safety precautions to be maintained throughout the outpatient stay will include: orient to surroundings, keep bed in low position, maintain call bell within reach at all times, provide assistance with transfer out of bed and ambulation.  

## 2017-06-17 DIAGNOSIS — Z79899 Other long term (current) drug therapy: Secondary | ICD-10-CM | POA: Insufficient documentation

## 2017-06-18 ENCOUNTER — Other Ambulatory Visit: Payer: Self-pay

## 2017-06-18 ENCOUNTER — Telehealth: Payer: Self-pay

## 2017-06-18 DIAGNOSIS — B2790 Infectious mononucleosis, unspecified without complication: Secondary | ICD-10-CM

## 2017-06-18 LAB — VITAMIN B12: Vitamin B-12: 170 pg/mL — ABNORMAL LOW (ref 232–1245)

## 2017-06-18 LAB — EPSTEIN-BARR VIRUS VCA ANTIBODY PANEL
EBV NA IgG: 60 U/mL — ABNORMAL HIGH (ref 0.0–17.9)
EBV VCA IgG: 341 U/mL — ABNORMAL HIGH (ref 0.0–17.9)

## 2017-06-18 NOTE — Telephone Encounter (Signed)
Please refer to infectious disease doctor. Patient has been notified. Order has been placed

## 2017-06-18 NOTE — Telephone Encounter (Signed)
Patient concerned about labwork from My chart.  Dr Laban EmperorNaveira notified of labwork.  Order received to refer to infectious disease and that there was nothing we could do for her at this point.  Patient notified.

## 2017-06-30 ENCOUNTER — Encounter: Payer: Commercial Managed Care - PPO | Admitting: Nurse Practitioner

## 2017-07-01 ENCOUNTER — Other Ambulatory Visit: Payer: Self-pay | Admitting: Pain Medicine

## 2017-07-01 ENCOUNTER — Encounter: Payer: Self-pay | Admitting: Pain Medicine

## 2017-07-01 DIAGNOSIS — R768 Other specified abnormal immunological findings in serum: Secondary | ICD-10-CM

## 2017-07-01 DIAGNOSIS — R894 Abnormal immunological findings in specimens from other organs, systems and tissues: Secondary | ICD-10-CM | POA: Insufficient documentation

## 2017-07-01 DIAGNOSIS — Z20828 Contact with and (suspected) exposure to other viral communicable diseases: Secondary | ICD-10-CM | POA: Insufficient documentation

## 2017-07-01 DIAGNOSIS — R760 Raised antibody titer: Secondary | ICD-10-CM | POA: Insufficient documentation

## 2017-07-01 DIAGNOSIS — G894 Chronic pain syndrome: Secondary | ICD-10-CM

## 2017-07-01 DIAGNOSIS — M7918 Myalgia, other site: Secondary | ICD-10-CM

## 2017-07-01 DIAGNOSIS — M797 Fibromyalgia: Secondary | ICD-10-CM

## 2017-07-27 ENCOUNTER — Ambulatory Visit (INDEPENDENT_AMBULATORY_CARE_PROVIDER_SITE_OTHER): Payer: Commercial Managed Care - PPO | Admitting: Infectious Diseases

## 2017-07-27 ENCOUNTER — Encounter: Payer: Self-pay | Admitting: Infectious Diseases

## 2017-07-27 VITALS — BP 109/65 | HR 64 | Temp 98.0°F | Wt 177.0 lb

## 2017-07-27 DIAGNOSIS — R894 Abnormal immunological findings in specimens from other organs, systems and tissues: Secondary | ICD-10-CM

## 2017-07-27 DIAGNOSIS — R768 Other specified abnormal immunological findings in serum: Secondary | ICD-10-CM
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
## 2017-07-28 NOTE — Assessment & Plan Note (Addendum)
Allison Dalton is a young woman with many ailments due to what seems to be a multifactorial, complicated set of processes. She has positive EBV IgG indicating previous infection but this would not be responsible for any of her current symptoms. While it is true that in the case of severe immunosuppression (such as solid organ transplant or HIV/AIDS) EBV can become reactivated and cause lymphoproliferative sequelae or recurrence of mononucleosis syndromes she has no evidence of an incompetent immune system - [facility] and [facility] she had normal immunoglobulin studies, she has no pancytopenia and is not currently on chronic immunosuppressive medicines. Considering she had improvement on phentermine regarding thought process, focus and such makes the possibility of late encephalitis related to EBV reactivation also an unreasonable explanation. No evidence of pericarditis on EKG obtained during ED visit yesterday and no atrial fibrillation.   There is no role for antiviral therapies even in primary EBV infection and nothing I can offer for her regarding treatment today. She was not readily accepting of the information I provided her and tells me that there is a community of people that are waiting for treatment from doctors who cannot understand. I do not doubt that the symptoms she is experiencing are real to her and do not want her to feel invalidated - I do not however want her to get stuck on epstein barr being the cause of her symptoms and miss what may be the true cause(s) leading to delay in potential diagnosis and treatment.

## 2017-08-03 ENCOUNTER — Emergency Department (HOSPITAL_COMMUNITY)
Admission: EM | Admit: 2017-08-03 | Discharge: 2017-08-03 | Disposition: A | Discharge status: Home / Self Care | Payer: Commercial Managed Care - PPO | Attending: Emergency Medicine | Admitting: Emergency Medicine

## 2017-08-03 ENCOUNTER — Encounter (HOSPITAL_COMMUNITY): Payer: Self-pay | Admitting: Emergency Medicine

## 2017-08-03 ENCOUNTER — Other Ambulatory Visit: Payer: Self-pay

## 2017-08-03 DIAGNOSIS — Z5321 Procedure and treatment not carried out due to patient leaving prior to being seen by health care provider: Secondary | ICD-10-CM | POA: Insufficient documentation

## 2017-08-03 DIAGNOSIS — R109 Unspecified abdominal pain: Secondary | ICD-10-CM | POA: Insufficient documentation

## 2017-08-03 LAB — URINALYSIS, ROUTINE W REFLEX MICROSCOPIC
Bacteria, UA: NONE SEEN
Bilirubin Urine: NEGATIVE
Glucose, UA: NEGATIVE mg/dL
Hgb urine dipstick: NEGATIVE
Ketones, ur: NEGATIVE mg/dL
Leukocytes, UA: NEGATIVE
Nitrite: NEGATIVE
Protein, ur: 30 mg/dL — AB
Specific Gravity, Urine: 1.029 (ref 1.005–1.030)
pH: 5 (ref 5.0–8.0)

## 2017-08-03 LAB — PREGNANCY, URINE: Preg Test, Ur: NEGATIVE

## 2017-08-03 NOTE — ED Notes (Signed)
[facility]. Pt states not feeling any better. History of kidney stones as well. Pt denies nausea and vomiting.

## 2017-08-03 NOTE — ED Notes (Signed)
Went to do vitals pt not in room

## 2017-08-03 NOTE — ED Triage Notes (Signed)
Patient c/o UTI, was seen ny PCP and treated. Symptoms are unresolved and patient reports pain in her flanks. History of nephrolithiasis.

## 2017-08-03 NOTE — ED Notes (Signed)
Pt left AMA, not in room and cannot be located

## 2017-08-24 ENCOUNTER — Ambulatory Visit: Payer: Self-pay | Admitting: Allergy and Immunology

## 2017-11-15 ENCOUNTER — Encounter (HOSPITAL_COMMUNITY): Payer: Self-pay | Admitting: Emergency Medicine

## 2017-11-15 ENCOUNTER — Emergency Department (HOSPITAL_COMMUNITY): Payer: Self-pay

## 2017-11-15 ENCOUNTER — Emergency Department (HOSPITAL_COMMUNITY)
Admission: EM | Admit: 2017-11-15 | Discharge: 2017-11-15 | Disposition: A | Discharge status: Home / Self Care | Payer: Self-pay | Attending: Emergency Medicine | Admitting: Emergency Medicine

## 2017-11-15 ENCOUNTER — Other Ambulatory Visit: Payer: Self-pay

## 2017-11-15 DIAGNOSIS — E119 Type 2 diabetes mellitus without complications: Secondary | ICD-10-CM | POA: Insufficient documentation

## 2017-11-15 DIAGNOSIS — R0789 Other chest pain: Secondary | ICD-10-CM | POA: Insufficient documentation

## 2017-11-15 DIAGNOSIS — Z79899 Other long term (current) drug therapy: Secondary | ICD-10-CM | POA: Insufficient documentation

## 2017-11-15 DIAGNOSIS — R0781 Pleurodynia: Secondary | ICD-10-CM

## 2017-11-15 DIAGNOSIS — T7840XA Allergy, unspecified, initial encounter: Secondary | ICD-10-CM | POA: Insufficient documentation

## 2017-11-15 DIAGNOSIS — Z87891 Personal history of nicotine dependence: Secondary | ICD-10-CM | POA: Insufficient documentation

## 2017-11-15 DIAGNOSIS — J45909 Unspecified asthma, uncomplicated: Secondary | ICD-10-CM | POA: Insufficient documentation

## 2017-11-15 MED ORDER — SODIUM CHLORIDE 0.9 % IV BOLUS
500.0000 mL | Freq: Once | INTRAVENOUS | Status: AC
  Administered 2017-11-15: 500 mL via INTRAVENOUS

## 2017-11-15 MED ORDER — DIPHENHYDRAMINE HCL 50 MG/ML IJ SOLN
25.0000 mg | Freq: Once | INTRAMUSCULAR | Status: AC
  Administered 2017-11-15: 25 mg via INTRAVENOUS
  Filled 2017-11-15: qty 1

## 2017-11-15 MED ORDER — NAPROXEN 500 MG PO TABS: 500.0000 mg | ORAL_TABLET | Freq: Two times a day (BID) | ORAL | 0 refills | Status: DC

## 2017-11-15 MED ORDER — METHYLPREDNISOLONE SODIUM SUCC 125 MG IJ SOLR
80.0000 mg | Freq: Once | INTRAMUSCULAR | Status: AC
  Administered 2017-11-15: 80 mg via INTRAVENOUS
  Filled 2017-11-15: qty 2

## 2017-11-15 MED ORDER — FAMOTIDINE IN NACL 20-0.9 MG/50ML-% IV SOLN
20.0000 mg | Freq: Once | INTRAVENOUS | Status: AC
Start: 2017-11-15 — End: 2017-11-15
  Administered 2017-11-15: 20 mg via INTRAVENOUS
  Filled 2017-11-15: qty 50

## 2017-11-15 NOTE — Discharge Instructions (Addendum)
Chest x-ray and EKG were normal.  Recommend Benadryl as needed for allergic concerns

## 2017-11-15 NOTE — ED Triage Notes (Signed)
Pt was given clindamycin cream yesterday for rash.  Pt used cream last night causing a rash with chest and face with nausea.

## 2017-11-16 ENCOUNTER — Emergency Department (HOSPITAL_COMMUNITY)
Admission: EM | Admit: 2017-11-16 | Discharge: 2017-11-16 | Disposition: A | Discharge status: Home / Self Care | Payer: Self-pay | Attending: Emergency Medicine | Admitting: Emergency Medicine

## 2017-11-16 ENCOUNTER — Other Ambulatory Visit: Payer: Self-pay

## 2017-11-16 ENCOUNTER — Encounter (HOSPITAL_COMMUNITY): Payer: Self-pay

## 2017-11-16 DIAGNOSIS — T7840XA Allergy, unspecified, initial encounter: Secondary | ICD-10-CM

## 2017-11-16 DIAGNOSIS — Z87891 Personal history of nicotine dependence: Secondary | ICD-10-CM | POA: Insufficient documentation

## 2017-11-16 DIAGNOSIS — T782XXA Anaphylactic shock, unspecified, initial encounter: Secondary | ICD-10-CM | POA: Insufficient documentation

## 2017-11-16 DIAGNOSIS — J45909 Unspecified asthma, uncomplicated: Secondary | ICD-10-CM | POA: Insufficient documentation

## 2017-11-16 DIAGNOSIS — E119 Type 2 diabetes mellitus without complications: Secondary | ICD-10-CM | POA: Insufficient documentation

## 2017-11-16 MED ORDER — EPINEPHRINE 0.3 MG/0.3ML IJ SOAJ: 0.3000 mg | Freq: Once | INTRAMUSCULAR | 0 refills | Status: AC

## 2017-11-16 MED ORDER — SODIUM CHLORIDE 0.9 % IV BOLUS
1000.0000 mL | Freq: Once | INTRAVENOUS | Status: AC
  Administered 2017-11-16: 1000 mL via INTRAVENOUS

## 2017-11-16 MED ORDER — DIPHENHYDRAMINE HCL 50 MG/ML IJ SOLN
50.0000 mg | Freq: Once | INTRAMUSCULAR | Status: AC
  Administered 2017-11-16: 50 mg via INTRAVENOUS
  Filled 2017-11-16: qty 1

## 2017-11-16 MED ORDER — EPINEPHRINE 0.3 MG/0.3ML IJ SOAJ
0.3000 mg | Freq: Once | INTRAMUSCULAR | Status: AC
  Administered 2017-11-16: 0.3 mg via INTRAMUSCULAR
  Filled 2017-11-16: qty 0.3

## 2017-11-16 MED ORDER — PREDNISONE 20 MG PO TABS: 40.0000 mg | ORAL_TABLET | Freq: Every day | ORAL | 0 refills | Status: AC

## 2017-11-16 MED ORDER — FAMOTIDINE IN NACL 20-0.9 MG/50ML-% IV SOLN
40.0000 mg | Freq: Once | INTRAVENOUS | Status: AC
  Administered 2017-11-16: 40 mg via INTRAVENOUS
  Filled 2017-11-16: qty 100

## 2017-11-16 MED ORDER — METHYLPREDNISOLONE SODIUM SUCC 125 MG IJ SOLR
125.0000 mg | Freq: Once | INTRAMUSCULAR | Status: AC
  Administered 2017-11-16: 125 mg via INTRAVENOUS
  Filled 2017-11-16: qty 2

## 2017-11-16 NOTE — Discharge Instructions (Signed)

## 2017-11-16 NOTE — ED Triage Notes (Signed)
Pt here yesterday for allergic reaction.  Reports hives came back and feels like throat is closing since approx 0600 this morning.    Pt c/o intermittent wheezing and sob.  Pt last took benadryl around 6 am.
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
## 2017-11-25 IMAGING — CT CT ABD-PELV W/ CM
2 of 4 series · 16 of 46 positions shown, 18 images · IV contrast (Isovue)
Comparison: 03/14/2017

CLINICAL DATA: 26-year-old with abdominal pain.

EXAM:
CT ABDOMEN AND PELVIS WITH CONTRAST
TECHNIQUE: Multidetector CT imaging of the abdomen and pelvis was performed
using the standard protocol following bolus administration of
intravenous contrast.
CONTRAST:  100mL ZAMMIU-9QQ IOPAMIDOL (ZAMMIU-9QQ) INJECTION 61%

[Series 2: axial st · axial · 0.71mm/px · z∈[+818,+1238]mm · 13 of 92 slices shown, 15 images]
[im 4/92  soft-tissue]
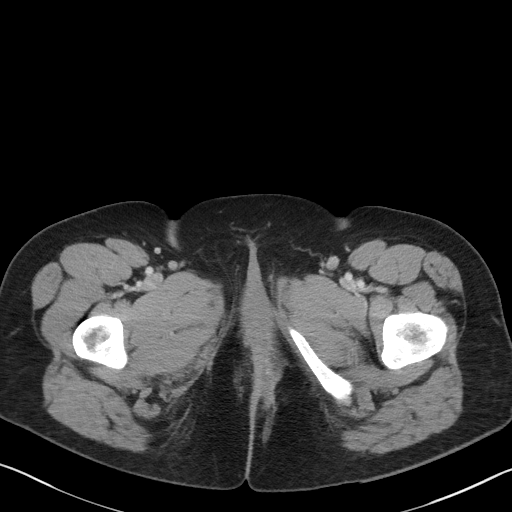
[im 4/92  bone]
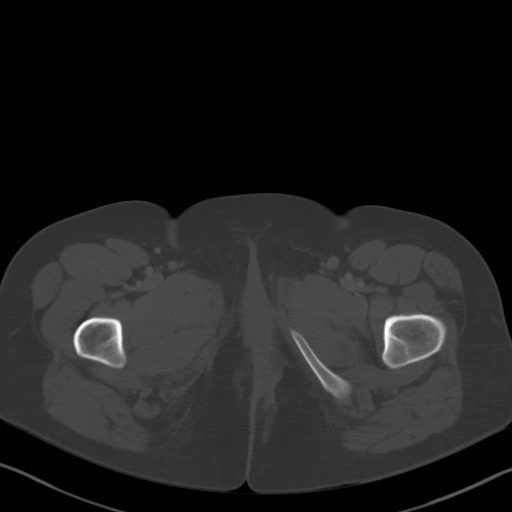
[im 11/92  soft-tissue]
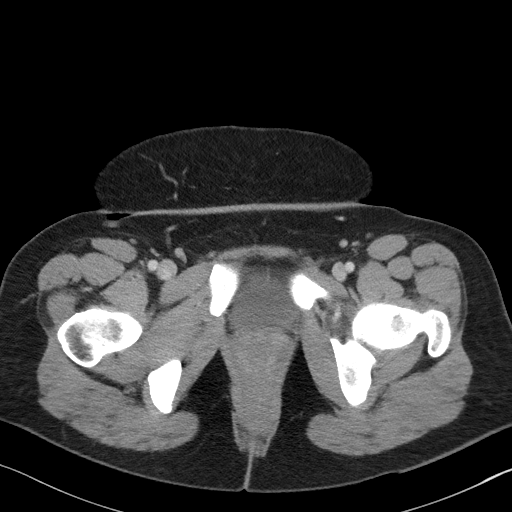
[im 19/92  soft-tissue]
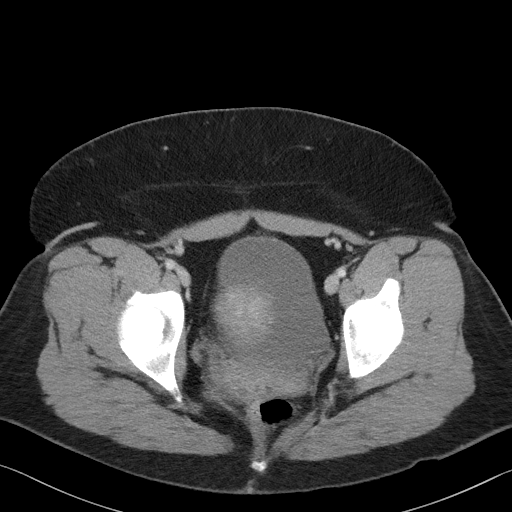
[im 26/92  soft-tissue]
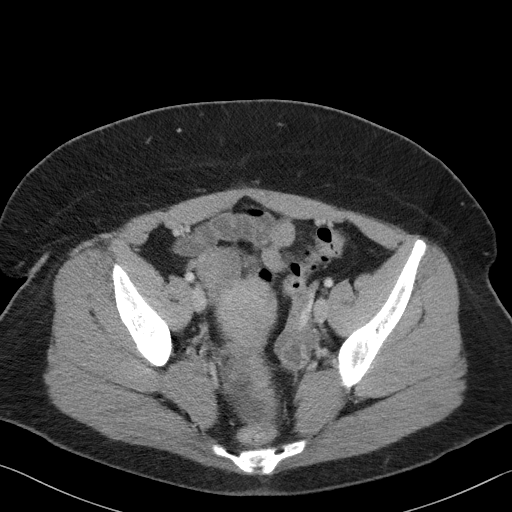
[im 33/92  soft-tissue]
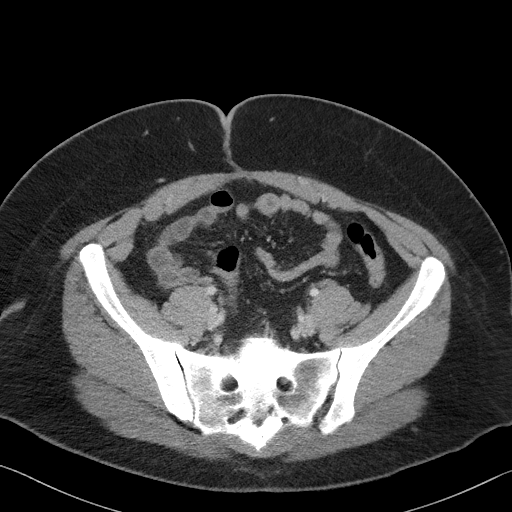
[im 41/92  soft-tissue]
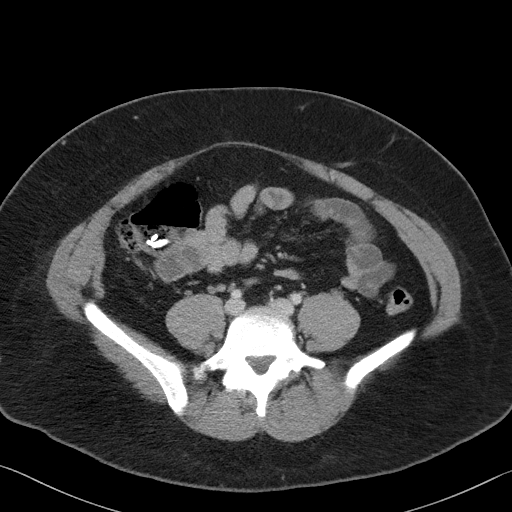
[im 48/92  soft-tissue]
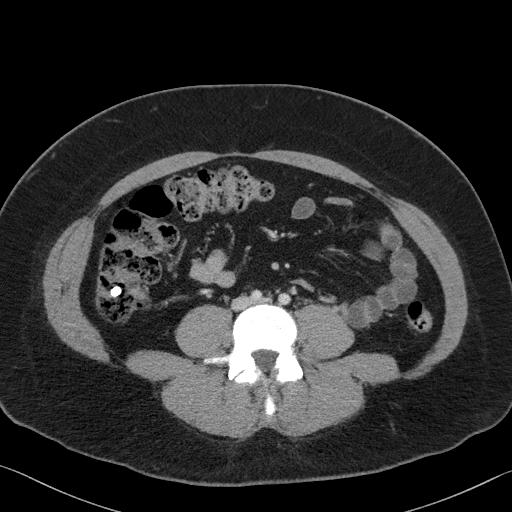
[im 51/92  soft-tissue]
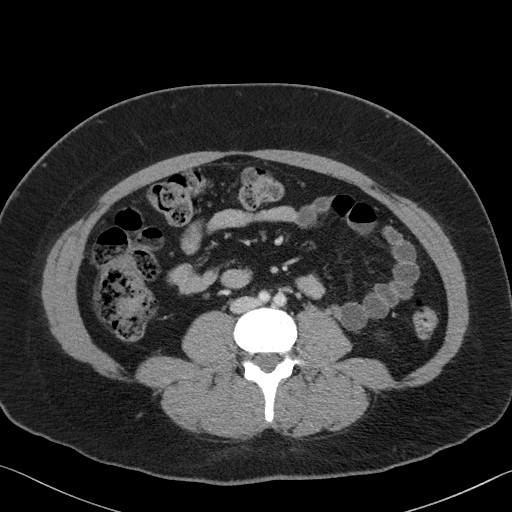
[im 59/92  soft-tissue]
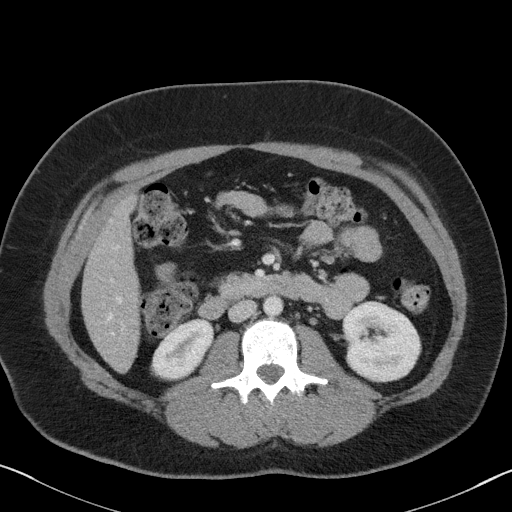
[im 59/92  bone]
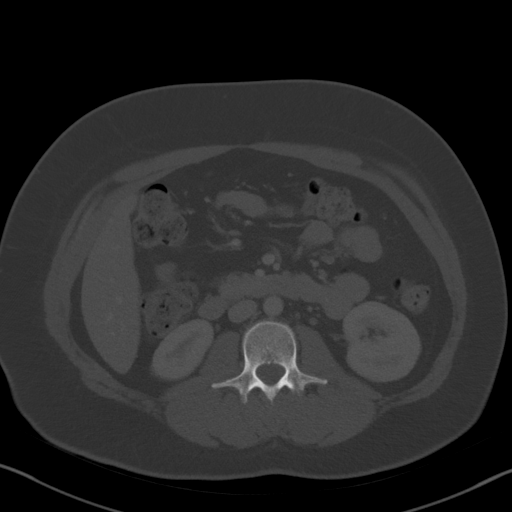
[im 66/92  soft-tissue]
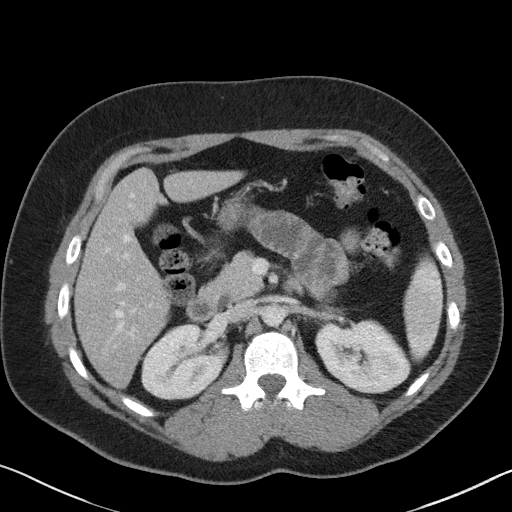
[im 73/92  soft-tissue]
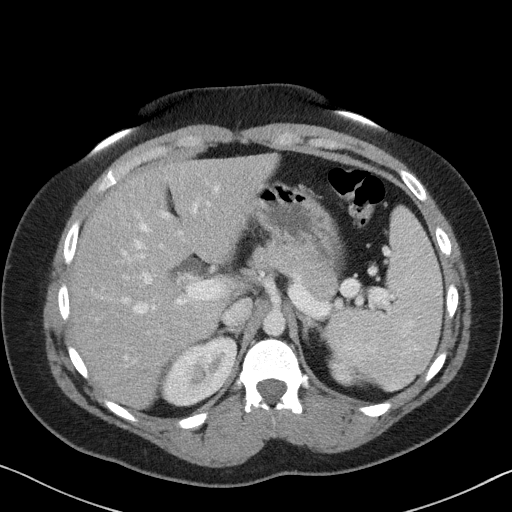
[im 81/92  soft-tissue]
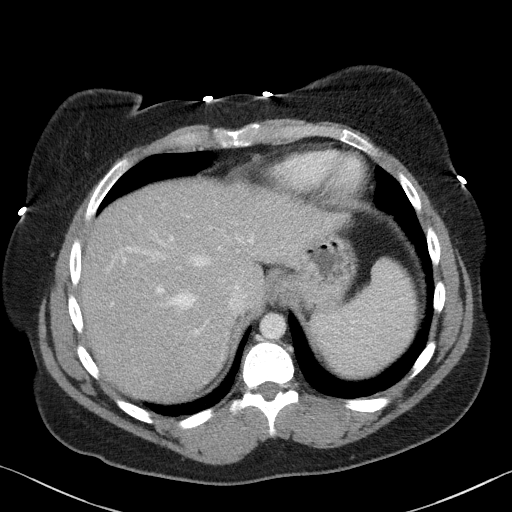
[im 88/92  soft-tissue]
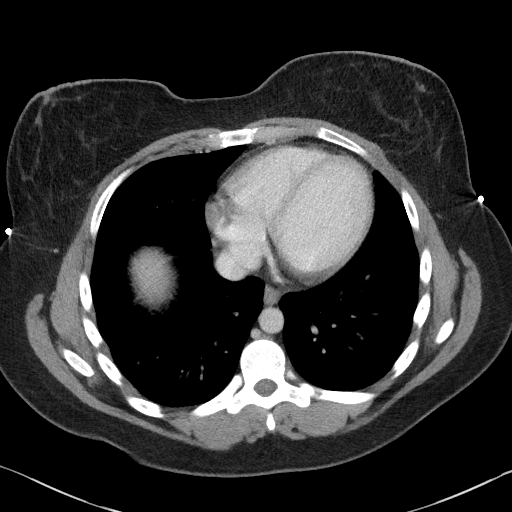

[Series 5: coronal st · coronal · 0.69mm/px · 3 of 97 slices shown]
[im 33/97  soft-tissue]
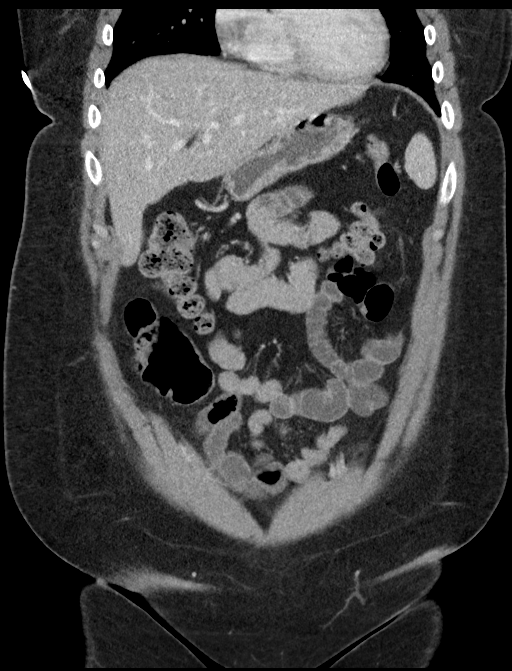
[im 43/97  soft-tissue]
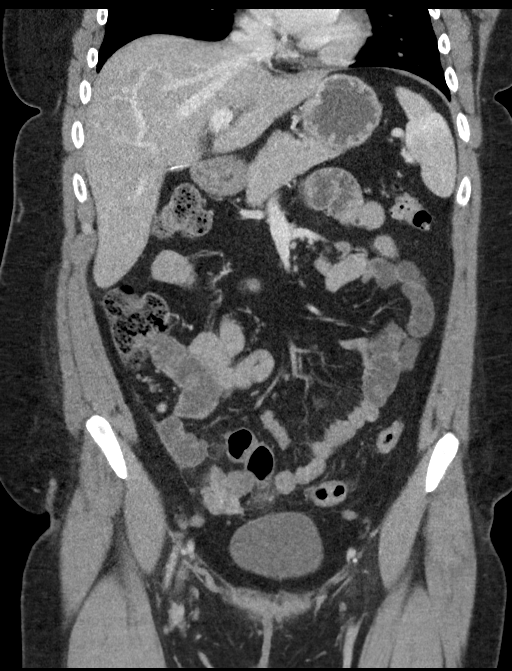
[im 54/97  soft-tissue]
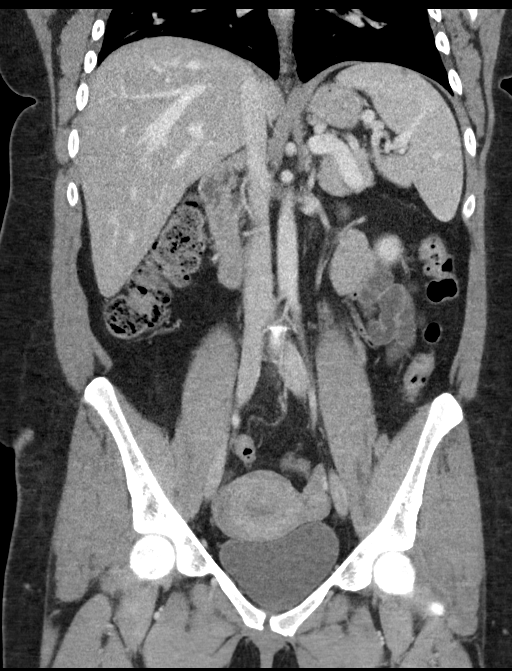

[16 of 46 positions shown; findings below may reference images not displayed]

FINDINGS: Lower chest: Lung bases are clear.

Hepatobiliary: Liver attenuation is slightly lower than the spleen
and raises concern for mild steatosis. Portal venous system is
patent. No suspicious liver lesions. Gallbladder has been removed.
No biliary dilatation.

Pancreas: Normal appearance of the pancreas without inflammation or
duct dilatation.

Spleen: Normal appearance of spleen without enlargement.

Adrenals/Urinary Tract: Normal adrenal glands. 4 mm stone in the
right kidney lower pole without hydronephrosis. Two small stones in
left kidney without hydronephrosis. Urinary bladder is unremarkable.

Stomach/Bowel: Evidence for ingested tablets in the right colon.
Normal appendix. Moderate amount of stool in the right colon. No
evidence for bowel dilatation or focal bowel inflammation. However,
there is a small focus of inflammation in the anterior pelvis on
sequence 2, image 69 and best seen on the coronal formats, sequence
5 image 45. Small amount of inflammation is adjacent to the sigmoid
colon just anterior to the uterine fundus. This may represent
epiploic appendagitis.

Vascular/Lymphatic: No significant vascular findings are present. No
enlarged abdominal or pelvic lymph nodes.

Reproductive: Uterus and bilateral adnexa are unremarkable.

Other: Trace fluid in the cul-de-sac.  No evidence for free air.

Musculoskeletal: No acute bone abnormality.
IMPRESSION: Small focus of inflammation in the anterior pelvis adjacent to the
sigmoid colon and uterine fundus. Findings may represent epiploic
appendagitis at this location. No evidence for a fluid or abscess
collection.

Nonobstructive bilateral renal calculi.

Question mild hepatic steatosis.

## 2018-04-13 ENCOUNTER — Emergency Department (HOSPITAL_COMMUNITY): Payer: Self-pay

## 2018-04-13 ENCOUNTER — Other Ambulatory Visit: Payer: Self-pay

## 2018-04-13 ENCOUNTER — Encounter (HOSPITAL_COMMUNITY): Payer: Self-pay | Admitting: Emergency Medicine

## 2018-04-13 ENCOUNTER — Emergency Department (HOSPITAL_COMMUNITY)
Admission: EM | Admit: 2018-04-13 | Discharge: 2018-04-13 | Disposition: A | Discharge status: Home / Self Care | Payer: Self-pay | Attending: Emergency Medicine | Admitting: Emergency Medicine

## 2018-04-13 DIAGNOSIS — J45909 Unspecified asthma, uncomplicated: Secondary | ICD-10-CM | POA: Insufficient documentation

## 2018-04-13 DIAGNOSIS — M7062 Trochanteric bursitis, left hip: Secondary | ICD-10-CM | POA: Insufficient documentation

## 2018-04-13 DIAGNOSIS — Y939 Activity, unspecified: Secondary | ICD-10-CM | POA: Insufficient documentation

## 2018-04-13 DIAGNOSIS — Z87891 Personal history of nicotine dependence: Secondary | ICD-10-CM | POA: Insufficient documentation

## 2018-04-13 DIAGNOSIS — E119 Type 2 diabetes mellitus without complications: Secondary | ICD-10-CM | POA: Insufficient documentation

## 2018-04-13 DIAGNOSIS — J209 Acute bronchitis, unspecified: Secondary | ICD-10-CM | POA: Insufficient documentation

## 2018-04-13 DIAGNOSIS — Z79899 Other long term (current) drug therapy: Secondary | ICD-10-CM | POA: Insufficient documentation

## 2018-04-13 HISTORY — DX: Unspecified mood (affective) disorder: F39

## 2018-04-13 MED ORDER — AZITHROMYCIN 250 MG PO TABS: ORAL_TABLET | ORAL | 0 refills | Status: DC

## 2018-04-13 MED ORDER — NAPROXEN 500 MG PO TABS: 500.0000 mg | ORAL_TABLET | Freq: Two times a day (BID) | ORAL | 0 refills | Status: DC
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
## 2018-04-13 NOTE — ED Notes (Signed)
Patient transported to X-ray 

## 2018-04-13 NOTE — ED Notes (Signed)
Pt refused to give urine sample. Pt states she has her tubes tied and does not know why she needs one.

## 2018-04-13 NOTE — Discharge Instructions (Addendum)
Take the entire course of the antibiotics. Your chest xray is clear but I suspect you have an early bronchitis.  Your exam suggests that this is not a sinus infection, but this antibiotic would help with this as well if this was to develop.  Use the naproxen along with heat to your left hip 20 minutes 3 times daily.  Avoid pressure (lying) on your left side.  Get rechecked if this does not improve your hip pain.  Your hip xray is negative as well.

## 2018-04-13 NOTE — ED Notes (Signed)
Notified X-Ray that pt refused urine preg

## 2018-04-13 NOTE — ED Triage Notes (Signed)
Cough, congestion since yesterday 

## 2018-06-01 ENCOUNTER — Emergency Department
Admission: EM | Admit: 2018-06-01 | Discharge: 2018-06-01 | Disposition: A | Discharge status: Home / Self Care | Payer: Self-pay | Attending: Emergency Medicine | Admitting: Emergency Medicine

## 2018-06-01 ENCOUNTER — Emergency Department: Payer: Self-pay

## 2018-06-01 ENCOUNTER — Encounter: Payer: Self-pay | Admitting: Emergency Medicine

## 2018-06-01 ENCOUNTER — Other Ambulatory Visit: Payer: Self-pay

## 2018-06-01 DIAGNOSIS — M549 Dorsalgia, unspecified: Secondary | ICD-10-CM

## 2018-06-01 DIAGNOSIS — Z79899 Other long term (current) drug therapy: Secondary | ICD-10-CM | POA: Insufficient documentation

## 2018-06-01 DIAGNOSIS — M5489 Other dorsalgia: Secondary | ICD-10-CM | POA: Insufficient documentation

## 2018-06-01 DIAGNOSIS — R102 Pelvic and perineal pain: Secondary | ICD-10-CM | POA: Insufficient documentation

## 2018-06-01 DIAGNOSIS — N200 Calculus of kidney: Secondary | ICD-10-CM | POA: Insufficient documentation

## 2018-06-01 DIAGNOSIS — R109 Unspecified abdominal pain: Secondary | ICD-10-CM

## 2018-06-01 DIAGNOSIS — N760 Acute vaginitis: Secondary | ICD-10-CM | POA: Insufficient documentation

## 2018-06-01 DIAGNOSIS — J45909 Unspecified asthma, uncomplicated: Secondary | ICD-10-CM | POA: Insufficient documentation

## 2018-06-01 DIAGNOSIS — B9689 Other specified bacterial agents as the cause of diseases classified elsewhere: Secondary | ICD-10-CM

## 2018-06-01 DIAGNOSIS — Z87891 Personal history of nicotine dependence: Secondary | ICD-10-CM | POA: Insufficient documentation

## 2018-06-01 DIAGNOSIS — E119 Type 2 diabetes mellitus without complications: Secondary | ICD-10-CM | POA: Insufficient documentation

## 2018-06-01 LAB — WET PREP, GENITAL
Clue Cells Wet Prep HPF POC: NONE SEEN
SPERM: NONE SEEN
Trich, Wet Prep: NONE SEEN
YEAST WET PREP: NONE SEEN

## 2018-06-01 LAB — BASIC METABOLIC PANEL
Anion gap: 6 (ref 5–15)
BUN: 11 mg/dL (ref 6–20)
CO2: 25 mmol/L (ref 22–32)
Calcium: 8.9 mg/dL (ref 8.9–10.3)
Chloride: 108 mmol/L (ref 98–111)
Creatinine, Ser: 0.71 mg/dL (ref 0.44–1.00)
GFR calc Af Amer: >60 mL/min (ref >60)
GFR calc non Af Amer: >60 mL/min (ref >60)
Glucose, Bld: 111 mg/dL — ABNORMAL HIGH (ref 70–99)
Potassium: 4.2 mmol/L (ref 3.5–5.1)
Sodium: 139 mmol/L (ref 135–145)

## 2018-06-01 LAB — URINALYSIS, COMPLETE (UACMP) WITH MICROSCOPIC
Bacteria, UA: NONE SEEN
Bilirubin Urine: NEGATIVE
Glucose, UA: NEGATIVE mg/dL
Hgb urine dipstick: NEGATIVE
Ketones, ur: NEGATIVE mg/dL
Leukocytes, UA: NEGATIVE
Nitrite: NEGATIVE
Protein, ur: NEGATIVE mg/dL
Specific Gravity, Urine: 1.02 (ref 1.005–1.030)
Squamous Epithelial / HPF: NONE SEEN (ref 0–5)
pH: 6 (ref 5.0–8.0)

## 2018-06-01 LAB — CBC
HCT: 43.3 % (ref 36.0–46.0)
Hemoglobin: 14.3 g/dL (ref 12.0–15.0)
MCH: 29.1 pg (ref 26.0–34.0)
MCHC: 33 g/dL (ref 30.0–36.0)
MCV: 88.2 fL (ref 80.0–100.0)
Platelets: 241 10*3/uL (ref 150–400)
RBC: 4.91 MIL/uL (ref 3.87–5.11)
RDW: 13.2 % (ref 11.5–15.5)
WBC: 8.2 10*3/uL (ref 4.0–10.5)
nRBC: 0 % (ref 0.0–0.2)

## 2018-06-01 LAB — CHLAMYDIA/NGC RT PCR (ARMC ONLY)
Chlamydia Tr: NOT DETECTED
N gonorrhoeae: NOT DETECTED

## 2018-06-01 LAB — POCT PREGNANCY, URINE: Preg Test, Ur: NEGATIVE

## 2018-06-01 MED ORDER — GENTAMICIN SULFATE 40 MG/ML IJ SOLN
240.0000 mg | Freq: Once | INTRAMUSCULAR | Status: AC
  Administered 2018-06-01: 240 mg via INTRAMUSCULAR
  Filled 2018-06-01: qty 6

## 2018-06-01 MED ORDER — HYDROCODONE-ACETAMINOPHEN 5-325 MG PO TABS: 1.0000 | ORAL_TABLET | Freq: Four times a day (QID) | ORAL | 0 refills | Status: DC | PRN

## 2018-06-01 MED ORDER — KETOROLAC TROMETHAMINE 30 MG/ML IJ SOLN
30.0000 mg | Freq: Once | INTRAMUSCULAR | Status: AC
Start: 2018-06-01 — End: 2018-06-01
  Administered 2018-06-01: 30 mg via INTRAMUSCULAR
  Filled 2018-06-01: qty 1

## 2018-06-01 MED ORDER — METRONIDAZOLE 500 MG PO TABS: 500.0000 mg | ORAL_TABLET | Freq: Two times a day (BID) | ORAL | 0 refills | Status: DC

## 2018-06-01 MED ORDER — ONDANSETRON 4 MG PO TBDP
4.0000 mg | ORAL_TABLET | Freq: Once | ORAL | Status: AC
  Administered 2018-06-01: 4 mg via ORAL
  Filled 2018-06-01: qty 1

## 2018-06-01 MED ORDER — AZITHROMYCIN 500 MG PO TABS
1000.0000 mg | ORAL_TABLET | Freq: Once | ORAL | Status: AC
  Administered 2018-06-01: 1000 mg via ORAL
  Filled 2018-06-01: qty 2

## 2018-06-01 NOTE — ED Triage Notes (Addendum)
Pt presents with known kidney stone hx. States "they must be moving." States she awakened this morning with more severe pain. She states she has had some urinary symptoms and discharge in last two days. Pt tearful during triage.

## 2018-06-01 NOTE — Discharge Instructions (Signed)
Call make an appointment with your primary care provider for follow-up of your vaginal discharge as well as your muscle skeletal pain.  You were given medication while in the emergency department.  You may discontinue taking your doxycycline at this time.  Begin taking Flagyl 500 mg twice daily until finished and Norco every 6 hours as needed for pain.  Return to the emergency department if any severe worsening such as fever, chills, vomiting or inability to take your antibiotics. Patient also follow-up with your urologist as the stones that you have are within the kidney itself.
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
## 2018-06-01 NOTE — ED Notes (Signed)
See triage note  Presents with right flank pain and also pain in mid/upper back  States she has a hx of renal stones

## 2018-06-02 ENCOUNTER — Emergency Department (HOSPITAL_COMMUNITY)
Admission: EM | Admit: 2018-06-02 | Discharge: 2018-06-02 | Disposition: A | Discharge status: Home / Self Care | Payer: Self-pay | Attending: Emergency Medicine | Admitting: Emergency Medicine

## 2018-06-02 ENCOUNTER — Other Ambulatory Visit: Payer: Self-pay

## 2018-06-02 ENCOUNTER — Emergency Department (HOSPITAL_COMMUNITY): Payer: Self-pay

## 2018-06-02 DIAGNOSIS — R21 Rash and other nonspecific skin eruption: Secondary | ICD-10-CM | POA: Insufficient documentation

## 2018-06-02 DIAGNOSIS — R000 Tachycardia, unspecified: Secondary | ICD-10-CM | POA: Insufficient documentation

## 2018-06-02 DIAGNOSIS — R072 Precordial pain: Secondary | ICD-10-CM | POA: Insufficient documentation

## 2018-06-02 DIAGNOSIS — Z87891 Personal history of nicotine dependence: Secondary | ICD-10-CM | POA: Insufficient documentation

## 2018-06-02 DIAGNOSIS — J45909 Unspecified asthma, uncomplicated: Secondary | ICD-10-CM | POA: Insufficient documentation

## 2018-06-02 DIAGNOSIS — E119 Type 2 diabetes mellitus without complications: Secondary | ICD-10-CM | POA: Insufficient documentation

## 2018-06-02 DIAGNOSIS — R0602 Shortness of breath: Secondary | ICD-10-CM | POA: Insufficient documentation

## 2018-06-02 LAB — CBC WITH DIFFERENTIAL/PLATELET
Abs Immature Granulocytes: 0.03 10*3/uL (ref 0.00–0.07)
BASOS ABS: 0 10*3/uL (ref 0.0–0.1)
Basophils Relative: 1 %
EOS PCT: 2 %
Eosinophils Absolute: 0.1 10*3/uL (ref 0.0–0.5)
HEMATOCRIT: 43.1 % (ref 36.0–46.0)
HEMOGLOBIN: 13.9 g/dL (ref 12.0–15.0)
IMMATURE GRANULOCYTES: 1 %
LYMPHS ABS: 1.7 10*3/uL (ref 0.7–4.0)
LYMPHS PCT: 28 %
MCH: 28.9 pg (ref 26.0–34.0)
MCHC: 32.3 g/dL (ref 30.0–36.0)
MCV: 89.6 fL (ref 80.0–100.0)
MONOS PCT: 8 %
Monocytes Absolute: 0.5 10*3/uL (ref 0.1–1.0)
NEUTROS PCT: 60 %
NRBC: 0 % (ref 0.0–0.2)
Neutro Abs: 3.7 10*3/uL (ref 1.7–7.7)
Platelets: 220 10*3/uL (ref 150–400)
RBC: 4.81 MIL/uL (ref 3.87–5.11)
RDW: 13.2 % (ref 11.5–15.5)
WBC: 6 10*3/uL (ref 4.0–10.5)

## 2018-06-02 LAB — COMPREHENSIVE METABOLIC PANEL
ALK PHOS: 54 U/L (ref 38–126)
ALT: 79 U/L — ABNORMAL HIGH (ref 0–44)
ANION GAP: 5 (ref 5–15)
AST: 86 U/L — ABNORMAL HIGH (ref 15–41)
Albumin: 3.7 g/dL (ref 3.5–5.0)
BUN: 10 mg/dL (ref 6–20)
CALCIUM: 8.2 mg/dL — AB (ref 8.9–10.3)
CO2: 22 mmol/L (ref 22–32)
Chloride: 111 mmol/L (ref 98–111)
Creatinine, Ser: 0.78 mg/dL (ref 0.44–1.00)
GLUCOSE: 91 mg/dL (ref 70–99)
POTASSIUM: 3.7 mmol/L (ref 3.5–5.1)
Sodium: 138 mmol/L (ref 135–145)
Total Bilirubin: 1.2 mg/dL (ref 0.3–1.2)
Total Protein: 6.4 g/dL — ABNORMAL LOW (ref 6.5–8.1)

## 2018-06-02 LAB — I-STAT BETA HCG BLOOD, ED (MC, WL, AP ONLY)

## 2018-06-02 LAB — TROPONIN I

## 2018-06-02 LAB — URINE CULTURE: Culture: NO GROWTH

## 2018-06-02 LAB — D-DIMER, QUANTITATIVE: D-Dimer, Quant: 0.3 ug/mL-FEU (ref 0.00–0.50)

## 2018-06-02 MED ORDER — PREDNISONE 20 MG PO TABS: 40.0000 mg | ORAL_TABLET | Freq: Every day | ORAL | 0 refills | Status: AC

## 2018-06-02 MED ORDER — ONDANSETRON 4 MG PO TBDP: 4.0000 mg | ORAL_TABLET | Freq: Three times a day (TID) | ORAL | 0 refills | Status: DC | PRN

## 2018-06-02 MED ORDER — ONDANSETRON HCL 4 MG/2ML IJ SOLN
4.0000 mg | Freq: Once | INTRAMUSCULAR | Status: AC
  Administered 2018-06-02: 4 mg via INTRAVENOUS
  Filled 2018-06-02: qty 2

## 2018-06-02 MED ORDER — ALBUTEROL SULFATE (2.5 MG/3ML) 0.083% IN NEBU
2.5000 mg | INHALATION_SOLUTION | Freq: Once | RESPIRATORY_TRACT | Status: AC
  Administered 2018-06-02: 2.5 mg via RESPIRATORY_TRACT
  Filled 2018-06-02: qty 3

## 2018-06-02 MED ORDER — SODIUM CHLORIDE 0.9 % IV BOLUS
500.0000 mL | Freq: Once | INTRAVENOUS | Status: AC
  Administered 2018-06-02: 500 mL via INTRAVENOUS

## 2018-06-02 MED ORDER — KETOROLAC TROMETHAMINE 30 MG/ML IJ SOLN
30.0000 mg | Freq: Once | INTRAMUSCULAR | Status: AC
  Administered 2018-06-02: 30 mg via INTRAVENOUS
  Filled 2018-06-02: qty 1

## 2018-06-02 MED ORDER — EPINEPHRINE 0.3 MG/0.3ML IJ SOAJ: 0.3000 mg | Freq: Once | INTRAMUSCULAR | 0 refills | Status: AC

## 2018-06-02 NOTE — ED Triage Notes (Signed)
States she is having an allergic reaction to medication she received yesterday for an UTI

## 2018-06-02 NOTE — Discharge Instructions (Signed)
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
## 2018-06-02 NOTE — ED Notes (Signed)
Pt reports she was at [facility] ED yesterday and diagnosed with UTI. Pt was given 2 shots in her legs (pt unsure what the medications were) and then given prescription for Flagyl. Pt reports she is allergic to Penicillin. Pt reports ever since last night after the shots and her first dose of Flagyl she has been having SOB, nausea, confusion, generalized itching, "burning feeling from the inside out".

## 2018-06-06 ENCOUNTER — Other Ambulatory Visit: Payer: Self-pay

## 2018-06-06 ENCOUNTER — Emergency Department (HOSPITAL_COMMUNITY)
Admission: EM | Admit: 2018-06-06 | Discharge: 2018-06-06 | Disposition: A | Discharge status: Home / Self Care | Payer: Self-pay | Attending: Emergency Medicine | Admitting: Emergency Medicine

## 2018-06-06 ENCOUNTER — Encounter (HOSPITAL_COMMUNITY): Payer: Self-pay | Admitting: Emergency Medicine

## 2018-06-06 DIAGNOSIS — R109 Unspecified abdominal pain: Secondary | ICD-10-CM | POA: Insufficient documentation

## 2018-06-06 DIAGNOSIS — Z5321 Procedure and treatment not carried out due to patient leaving prior to being seen by health care provider: Secondary | ICD-10-CM | POA: Insufficient documentation

## 2018-06-06 LAB — LIPASE, BLOOD: LIPASE: 31 U/L (ref 11–51)

## 2018-06-06 LAB — COMPREHENSIVE METABOLIC PANEL
ALBUMIN: 4.5 g/dL (ref 3.5–5.0)
ALT: 31 U/L (ref 0–44)
AST: 17 U/L (ref 15–41)
Alkaline Phosphatase: 61 U/L (ref 38–126)
Anion gap: 9 (ref 5–15)
BUN: 11 mg/dL (ref 6–20)
CHLORIDE: 108 mmol/L (ref 98–111)
CO2: 21 mmol/L — AB (ref 22–32)
CREATININE: 0.76 mg/dL (ref 0.44–1.00)
Calcium: 8.8 mg/dL — ABNORMAL LOW (ref 8.9–10.3)
GFR calc Af Amer: >60 mL/min (ref >60)
GFR calc non Af Amer: >60 mL/min (ref >60)
GLUCOSE: 109 mg/dL — AB (ref 70–99)
POTASSIUM: 3.3 mmol/L — AB (ref 3.5–5.1)
Sodium: 138 mmol/L (ref 135–145)
Total Bilirubin: 0.9 mg/dL (ref 0.3–1.2)
Total Protein: 8 g/dL (ref 6.5–8.1)

## 2018-06-06 LAB — CBC
HCT: 47.4 % — ABNORMAL HIGH (ref 36.0–46.0)
HEMOGLOBIN: 15.4 g/dL — AB (ref 12.0–15.0)
MCH: 29.7 pg (ref 26.0–34.0)
MCHC: 32.5 g/dL (ref 30.0–36.0)
MCV: 91.3 fL (ref 80.0–100.0)
Platelets: 323 10*3/uL (ref 150–400)
RBC: 5.19 MIL/uL — ABNORMAL HIGH (ref 3.87–5.11)
RDW: 13.8 % (ref 11.5–15.5)
WBC: 13.6 10*3/uL — AB (ref 4.0–10.5)
nRBC: 0 % (ref 0.0–0.2)

## 2018-06-06 NOTE — ED Triage Notes (Addendum)
Pt c/o abdominal pain, constipation, and distention x 2-3 days. Pt states [facility] and left [facility]. Pt endorses nausea and chest pain.

## 2018-11-05 ENCOUNTER — Emergency Department (HOSPITAL_COMMUNITY): Payer: Self-pay

## 2018-11-05 ENCOUNTER — Other Ambulatory Visit: Payer: Self-pay

## 2018-11-05 ENCOUNTER — Encounter (HOSPITAL_COMMUNITY): Payer: Self-pay | Admitting: Radiology

## 2018-11-05 ENCOUNTER — Emergency Department (HOSPITAL_COMMUNITY)
Admission: EM | Admit: 2018-11-05 | Discharge: 2018-11-05 | Disposition: A | Discharge status: Home / Self Care | Payer: Self-pay | Attending: Emergency Medicine | Admitting: Emergency Medicine

## 2018-11-05 DIAGNOSIS — Y999 Unspecified external cause status: Secondary | ICD-10-CM | POA: Insufficient documentation

## 2018-11-05 DIAGNOSIS — W1789XA Other fall from one level to another, initial encounter: Secondary | ICD-10-CM | POA: Insufficient documentation

## 2018-11-05 DIAGNOSIS — Y92838 Other recreation area as the place of occurrence of the external cause: Secondary | ICD-10-CM | POA: Insufficient documentation

## 2018-11-05 DIAGNOSIS — J45909 Unspecified asthma, uncomplicated: Secondary | ICD-10-CM | POA: Insufficient documentation

## 2018-11-05 DIAGNOSIS — S40012A Contusion of left shoulder, initial encounter: Secondary | ICD-10-CM | POA: Insufficient documentation

## 2018-11-05 DIAGNOSIS — Z79899 Other long term (current) drug therapy: Secondary | ICD-10-CM | POA: Insufficient documentation

## 2018-11-05 DIAGNOSIS — Y9342 Activity, yoga: Secondary | ICD-10-CM | POA: Insufficient documentation

## 2018-11-05 DIAGNOSIS — Z87891 Personal history of nicotine dependence: Secondary | ICD-10-CM | POA: Insufficient documentation

## 2018-11-05 DIAGNOSIS — S5011XA Contusion of right forearm, initial encounter: Secondary | ICD-10-CM | POA: Insufficient documentation

## 2018-11-05 MED ORDER — MELOXICAM 7.5 MG PO TABS: 7.5000 mg | ORAL_TABLET | Freq: Two times a day (BID) | ORAL | 0 refills | Status: AC | PRN

## 2018-11-05 MED ORDER — IBUPROFEN 800 MG PO TABS
800.0000 mg | ORAL_TABLET | Freq: Once | ORAL | Status: AC
  Administered 2018-11-05: 12:00:00 800 mg via ORAL
  Filled 2018-11-05: qty 1
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
## 2018-11-05 NOTE — Discharge Instructions (Signed)
Your testing showed no specific fractures.  You may have bruising of the muscles or the bones, you may also have a small tear in your rotator cuff on the left.  Please apply ice and use ibuprofen 3 times daily If you are having increasing pain swelling numbness or weakness please return to the emergency department. This pain may last for several weeks or even longer.  I would highly recommend that you follow-up with an orthopedic surgeon if you are not better within 1 week.  I have given you the phone number for Dr. Romeo Apple who is our community orthopedist.

## 2018-11-05 NOTE — ED Triage Notes (Signed)
On yoga trapeze and fell  Now with pain to L shoulder and R wrist   Fall of 3-4 feet

## 2019-02-01 ENCOUNTER — Other Ambulatory Visit: Payer: Self-pay

## 2019-02-01 ENCOUNTER — Emergency Department (HOSPITAL_COMMUNITY): Payer: Self-pay

## 2019-02-01 ENCOUNTER — Emergency Department (HOSPITAL_COMMUNITY)
Admission: EM | Admit: 2019-02-01 | Discharge: 2019-02-01 | Disposition: A | Discharge status: Home / Self Care | Payer: Self-pay | Attending: Emergency Medicine | Admitting: Emergency Medicine

## 2019-02-01 ENCOUNTER — Encounter (HOSPITAL_COMMUNITY): Payer: Self-pay | Admitting: Emergency Medicine

## 2019-02-01 DIAGNOSIS — E119 Type 2 diabetes mellitus without complications: Secondary | ICD-10-CM | POA: Insufficient documentation

## 2019-02-01 DIAGNOSIS — N132 Hydronephrosis with renal and ureteral calculous obstruction: Secondary | ICD-10-CM | POA: Insufficient documentation

## 2019-02-01 DIAGNOSIS — Z79899 Other long term (current) drug therapy: Secondary | ICD-10-CM | POA: Insufficient documentation

## 2019-02-01 DIAGNOSIS — Z880 Allergy status to penicillin: Secondary | ICD-10-CM | POA: Insufficient documentation

## 2019-02-01 DIAGNOSIS — J45909 Unspecified asthma, uncomplicated: Secondary | ICD-10-CM | POA: Insufficient documentation

## 2019-02-01 DIAGNOSIS — N201 Calculus of ureter: Secondary | ICD-10-CM

## 2019-02-01 DIAGNOSIS — Z87891 Personal history of nicotine dependence: Secondary | ICD-10-CM | POA: Insufficient documentation

## 2019-02-01 DIAGNOSIS — Z87442 Personal history of urinary calculi: Secondary | ICD-10-CM | POA: Insufficient documentation

## 2019-02-01 LAB — CBC WITH DIFFERENTIAL/PLATELET
Abs Immature Granulocytes: 0.03 10*3/uL (ref 0.00–0.07)
Basophils Absolute: 0 10*3/uL (ref 0.0–0.1)
Basophils Relative: 0 %
Eosinophils Absolute: 0 10*3/uL (ref 0.0–0.5)
Eosinophils Relative: 1 %
HCT: 42.7 % (ref 36.0–46.0)
Hemoglobin: 13.9 g/dL (ref 12.0–15.0)
Immature Granulocytes: 0 %
Lymphocytes Relative: 12 %
Lymphs Abs: 1 10*3/uL (ref 0.7–4.0)
MCH: 28.6 pg (ref 26.0–34.0)
MCHC: 32.6 g/dL (ref 30.0–36.0)
MCV: 87.9 fL (ref 80.0–100.0)
Monocytes Absolute: 0.4 10*3/uL (ref 0.1–1.0)
Monocytes Relative: 5 %
Neutro Abs: 6.8 10*3/uL (ref 1.7–7.7)
Neutrophils Relative %: 82 %
Platelets: 222 10*3/uL (ref 150–400)
RBC: 4.86 MIL/uL (ref 3.87–5.11)
RDW: 12.5 % (ref 11.5–15.5)
WBC: 8.2 10*3/uL (ref 4.0–10.5)
nRBC: 0 % (ref 0.0–0.2)

## 2019-02-01 LAB — BASIC METABOLIC PANEL
Anion gap: 8 (ref 5–15)
BUN: 13 mg/dL (ref 6–20)
CO2: 25 mmol/L (ref 22–32)
Calcium: 8.9 mg/dL (ref 8.9–10.3)
Chloride: 104 mmol/L (ref 98–111)
Creatinine, Ser: 0.83 mg/dL (ref 0.44–1.00)
GFR calc Af Amer: >60 mL/min (ref >60)
GFR calc non Af Amer: >60 mL/min (ref >60)
Glucose, Bld: 109 mg/dL — ABNORMAL HIGH (ref 70–99)
Potassium: 3.8 mmol/L (ref 3.5–5.1)
Sodium: 137 mmol/L (ref 135–145)

## 2019-02-01 LAB — URINALYSIS, ROUTINE W REFLEX MICROSCOPIC
Bilirubin Urine: NEGATIVE
Glucose, UA: NEGATIVE mg/dL
Ketones, ur: NEGATIVE mg/dL
Leukocytes,Ua: NEGATIVE
Nitrite: NEGATIVE
Protein, ur: NEGATIVE mg/dL
RBC / HPF: >50 RBC/hpf — ABNORMAL HIGH (ref 0–5)
Specific Gravity, Urine: 1.01 (ref 1.005–1.030)
pH: 6 (ref 5.0–8.0)

## 2019-02-01 LAB — PREGNANCY, URINE: Preg Test, Ur: NEGATIVE

## 2019-02-01 MED ORDER — ONDANSETRON HCL 4 MG/2ML IJ SOLN
4.0000 mg | Freq: Once | INTRAMUSCULAR | Status: AC
  Administered 2019-02-01: 4 mg via INTRAVENOUS
  Filled 2019-02-01: qty 2

## 2019-02-01 MED ORDER — OXYCODONE-ACETAMINOPHEN 5-325 MG PO TABS: 1.0000 | ORAL_TABLET | ORAL | 0 refills | Status: DC | PRN

## 2019-02-01 MED ORDER — KETOROLAC TROMETHAMINE 30 MG/ML IJ SOLN
30.0000 mg | Freq: Once | INTRAMUSCULAR | Status: AC
  Administered 2019-02-01: 30 mg via INTRAVENOUS
  Filled 2019-02-01: qty 1

## 2019-02-01 MED ORDER — TAMSULOSIN HCL 0.4 MG PO CAPS: 0.4000 mg | ORAL_CAPSULE | Freq: Every day | ORAL | 0 refills | Status: DC

## 2019-02-01 MED ORDER — ONDANSETRON HCL 4 MG PO TABS: 4.0000 mg | ORAL_TABLET | Freq: Four times a day (QID) | ORAL | 0 refills | Status: DC

## 2019-02-01 MED ORDER — HYDROMORPHONE HCL 1 MG/ML IJ SOLN
1.0000 mg | Freq: Once | INTRAMUSCULAR | Status: AC
  Administered 2019-02-01: 1 mg via INTRAVENOUS
  Filled 2019-02-01: qty 1

## 2019-02-01 NOTE — ED Notes (Signed)
Pt needs to collect another urine sample for a ua

## 2019-02-01 NOTE — Discharge Instructions (Addendum)
Your CT scan today shows a 35mm kidney stone.  Take the pain medication as directed and call the urologist listed to arrange a follow-up appt.  Return to the ER for any worsening symptoms such as fever, vomiting or increasing pain

## 2019-02-01 NOTE — ED Notes (Signed)
McDiamid paged.

## 2019-02-01 NOTE — ED Triage Notes (Signed)
Patient reports urinary symptoms that started 2 days ago. Onset of R sided flank pain with radiation into the back started this am.
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
## 2019-02-11 ENCOUNTER — Telehealth: Payer: Self-pay | Admitting: Physician Assistant

## 2019-02-11 ENCOUNTER — Ambulatory Visit (INDEPENDENT_AMBULATORY_CARE_PROVIDER_SITE_OTHER)
Admission: RE | Admit: 2019-02-11 | Discharge: 2019-02-11 | Disposition: A | Discharge status: Home / Self Care | Payer: Self-pay | Source: Ambulatory Visit

## 2019-02-11 DIAGNOSIS — R8271 Bacteriuria: Secondary | ICD-10-CM

## 2019-02-11 DIAGNOSIS — R300 Dysuria: Secondary | ICD-10-CM

## 2019-02-11 DIAGNOSIS — R350 Frequency of micturition: Secondary | ICD-10-CM

## 2019-02-11 DIAGNOSIS — Z87442 Personal history of urinary calculi: Secondary | ICD-10-CM

## 2019-02-11 DIAGNOSIS — M549 Dorsalgia, unspecified: Secondary | ICD-10-CM

## 2019-02-11 DIAGNOSIS — N201 Calculus of ureter: Secondary | ICD-10-CM

## 2019-02-11 DIAGNOSIS — N200 Calculus of kidney: Secondary | ICD-10-CM

## 2019-02-11 DIAGNOSIS — N898 Other specified noninflammatory disorders of vagina: Secondary | ICD-10-CM

## 2019-02-11 MED ORDER — FLUCONAZOLE 150 MG PO TABS: 150.0000 mg | ORAL_TABLET | ORAL | 0 refills | Status: DC

## 2019-02-11 MED ORDER — SULFAMETHOXAZOLE-TRIMETHOPRIM 800-160 MG PO TABS: 1.0000 | ORAL_TABLET | Freq: Two times a day (BID) | ORAL | 0 refills | Status: DC

## 2019-02-11 NOTE — Progress Notes (Signed)
Based on what you shared with me, I feel your condition warrants further evaluation and I recommend that you be seen for a face to face office visit. I reviewed you questionnaire and medical record and see you were just seen in the ER for kidney stone. Giving concern for a more complicated urinary infection due to stone presence, etc, you will need to either contact your Urologist (there should be an MD on call for the office) or be seen at a local urgent care for further assessment.   NOTE: If you entered your credit card information for this eVisit, you will not be charged. You may see a "hold" on your card for the $35 but that hold will drop off and you will not have a charge processed.  If you are having a true medical emergency please call 911.     For an urgent face to face visit, [place] has five urgent care centers for your convenience:    [email or web address] to reserve your spot online an avoid wait times  [facility] (New Address!) [address], [place], [place] [address] *Just off [address], across the road from [place] hours of operation: Monday-Friday, 12 PM to 6 PM  Closed Saturday & Sunday   The following sites will take your insurance:  . [facility]    [phone]                  Get Driving Directions  [address], [place] [address] . 10 am to 8 pm Monday-Friday . 12 pm to 8 pm Saturday-Sunday   . [facility] at [place]                  Get Driving Directions  [address], [place], [place] [address] . 8 am to 8 pm Monday-Friday . 9 am to 6 pm Saturday . 11 am to 6 pm Sunday   . [facility] at [place]                  Get Driving Directions   [address] [place], [place] [address] . 8 am to 8 pm Monday-Friday . 8 am to 4 pm Saturday-Sunday    . [facility] Urgent Care at [place]                    Get Driving Directions  [phone]  [address], [place], [place] [address]  . Monday-Friday, 12 PM to 6 PM    Your e-visit answers were reviewed by a board certified advanced clinical practitioner to complete your personal care plan.  Thank you for using e-Visits.

## 2019-02-11 NOTE — ED Provider Notes (Signed)
Virtual Visit via Video Note:  Allison Dalton  initiated request for Telemedicine visit with [facility] Urgent Care team. I connected with Allison Dalton  on 02/11/2019 at 1:55 PM  for a synchronized telemedicine visit using a video enabled HIPPA compliant telemedicine application. I verified that I am speaking with Allison Dalton  using two identifiers. Jaynee Eagles, PA-C  was physically located in a [facility] Urgent care site and Allison Dalton was located at a different location.   The limitations of evaluation and management by telemedicine as well as the availability of in-person appointments were discussed. Patient was informed that she  may incur a bill ( including co-pay) for this virtual visit encounter. Allison Dalton  expressed understanding and gave verbal consent to proceed with virtual visit.     History of Present Illness:Allison Dalton  is a 29 y.o. female presents with several day history of dysuria that is mild-moderate, vaginal itching.  Patient was recently treated for renal stones, states that she passed them. She went to her rheumatologist thereafter and was told that she had "bacteria in her urine". This was at [facility]. She still has urinary sx and is worried about getting worse. Does not like Macrobid. Has not tried Bactrim. She plans on following up with her PCP on Monday.   ROS  Current Outpatient Medications  Medication Sig Dispense Refill  . albuterol (PROVENTIL HFA) 108 (90 Base) MCG/ACT inhaler Inhale 2 puffs into the lungs every 6 (six) hours as needed.    . ALPRAZolam (XANAX) 0.25 MG tablet Take 0.25 mg by mouth 3 (three) times daily as needed. for anxiety  0  . Benzoyl Peroxide 10 % LIQD Apply 1 application topically as needed.    . Cyanocobalamin 1000 MCG/ML KIT Inject 1,000 mcg as directed every 30 (thirty) days.    . diphenhydrAMINE (BENADRYL) 25 MG tablet Take 25 mg by mouth daily as needed for allergies or sleep.     . fluticasone (FLONASE) 50  MCG/ACT nasal spray Place 1 spray into both nostrils daily as needed for allergies.     . fluticasone (FLOVENT HFA) 110 MCG/ACT inhaler Inhale 1 puff into the lungs 2 (two) times daily.    Marland Kitchen gabapentin (NEURONTIN) 100 MG capsule TAKE 1 CAPSULE BY MOUTH ONCE DAILY AT BEDTIME FOR 7 DAYS THEN TAKE 1 CAPSULE TWICE DAILY FOR 7 DAYS THEN TAKE 1 CAPSULE THREE TIMES DAILY  0  . lamoTRIgine (LAMICTAL) 100 MG tablet Take 0.5 tablets by mouth 2 (two) times a day.    . lisdexamfetamine (VYVANSE) 40 MG capsule Take 1 capsule by mouth daily.    Marland Kitchen omeprazole (PRILOSEC) 20 MG capsule Take 20 mg daily by mouth.     . ondansetron (ZOFRAN) 4 MG tablet Take 1 tablet (4 mg total) by mouth every 6 (six) hours. 12 tablet 0  . oxyCODONE-acetaminophen (PERCOCET/ROXICET) 5-325 MG tablet Take 1 tablet by mouth every 4 (four) hours as needed. 12 tablet 0  . tamsulosin (FLOMAX) 0.4 MG CAPS capsule Take 1 capsule (0.4 mg total) by mouth daily. 20 capsule 0  . topiramate (TOPAMAX) 25 MG tablet Take 25 mg by mouth 2 (two) times daily.  11   No current facility-administered medications for this encounter.      Allergies  Allergen Reactions  . Penicillins Hives and Shortness Of Breath    Has patient had a PCN reaction causing immediate rash, facial/tongue/throat swelling, SOB or lightheadedness with hypotension: Yes Has patient had a PCN reaction causing  severe rash involving mucus membranes or skin necrosis: Yes Has patient had a PCN reaction that required hospitalization: Yes Has patient had a PCN reaction occurring within the last 10 years: Yes If all of the above answers are "NO", then may proceed with Cephalosporin use. \  . Amoxicillin-Pot Clavulanate Hives  . Clindamycin/Lincomycin     ointment   . Cocoa Other (See Comments)    chocolate  . Pecan Extract Allergy Skin Test Other (See Comments)  . Gentamicin Rash    Patient developed rash 12 hours after receiving IM dose  . Other Rash    Uncoded Allergy.  Allergen: Chocolate Uncoded Allergy. Allergen: PECANS     Past Medical History:  Diagnosis Date  . Anxiety   . Asthma   . Chronic pain   . Depression   . GERD (gastroesophageal reflux disease)   . Migraine headache   . Mood disorder ([place])   . Vitamin B12 deficiency     Past Surgical History:  Procedure Laterality Date  . CHOLECYSTECTOMY    . TUBAL LIGATION    . UPPER GASTROINTESTINAL ENDOSCOPY        Observations/Objective: Physical Exam   Assessment and Plan:  1. Dysuria   2. Bacteriuria   3. Urinary frequency   4. Vaginal itching   5. Renal stones     50+ Bacteria noted on urinalysis from 02/08/2019 from [place]. However, no sign of urine culture. Will use Bactrim for 5 days to cover for cystitis. Counseled that without a urine culture, Bactrim may not work. Patient verbalized understanding, will f/u with her PCP. Use diflucan for antibiotic associated yeast infection. Counseled patient on potential for adverse effects with medications prescribed/recommended today, ER and return-to-clinic precautions discussed, patient verbalized understanding.   Follow Up Instructions:    I discussed the assessment and treatment plan with the patient. The patient was provided an opportunity to ask questions and all were answered. The patient agreed with the plan and demonstrated an understanding of the instructions.   The patient was advised to call back or seek an in-person evaluation if the symptoms worsen or if the condition fails to improve as anticipated.  I provided 15 minutes of non-face-to-face time during this encounter.    Jaynee Eagles, PA-C  02/11/2019 1:55 PM      Jaynee Eagles, PA-C 02/11/19 1409

## 2019-04-07 ENCOUNTER — Emergency Department
Admission: EM | Admit: 2019-04-07 | Discharge: 2019-04-07 | Disposition: A | Discharge status: Home / Self Care | Payer: Self-pay | Attending: Emergency Medicine | Admitting: Emergency Medicine

## 2019-04-07 ENCOUNTER — Other Ambulatory Visit: Payer: Self-pay

## 2019-04-07 DIAGNOSIS — Z79899 Other long term (current) drug therapy: Secondary | ICD-10-CM | POA: Insufficient documentation

## 2019-04-07 DIAGNOSIS — H6982 Other specified disorders of Eustachian tube, left ear: Secondary | ICD-10-CM | POA: Insufficient documentation

## 2019-04-07 DIAGNOSIS — J45909 Unspecified asthma, uncomplicated: Secondary | ICD-10-CM | POA: Insufficient documentation

## 2019-04-07 DIAGNOSIS — J029 Acute pharyngitis, unspecified: Secondary | ICD-10-CM | POA: Insufficient documentation

## 2019-04-07 DIAGNOSIS — E119 Type 2 diabetes mellitus without complications: Secondary | ICD-10-CM | POA: Insufficient documentation

## 2019-04-07 DIAGNOSIS — Z87891 Personal history of nicotine dependence: Secondary | ICD-10-CM | POA: Insufficient documentation

## 2019-04-07 LAB — GROUP A STREP BY PCR: Group A Strep by PCR: NOT DETECTED

## 2019-04-07 MED ORDER — FEXOFENADINE-PSEUDOEPHED ER 60-120 MG PO TB12: 1.0000 | ORAL_TABLET | Freq: Two times a day (BID) | ORAL | 0 refills | Status: DC

## 2019-04-07 MED ORDER — LIDOCAINE VISCOUS HCL 2 % MT SOLN: 5.0000 mL | Freq: Four times a day (QID) | OROMUCOSAL | 0 refills | Status: DC | PRN

## 2019-04-07 MED ORDER — METHYLPREDNISOLONE 4 MG PO TBPK: ORAL_TABLET | ORAL | 0 refills | Status: DC

## 2019-04-07 NOTE — ED Notes (Signed)
See triage note  Presents with sore throat since yesterday  States she is also having some pain to left ear    Increased pain with swallowing

## 2019-04-07 NOTE — ED Triage Notes (Signed)
Pt c/o sore throat and left ear pain since yesterday.

## 2019-04-07 NOTE — Discharge Instructions (Signed)
Follow discharge care instruction take medication as directed. °
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
## 2019-06-22 ENCOUNTER — Other Ambulatory Visit: Payer: Self-pay

## 2019-06-22 ENCOUNTER — Ambulatory Visit
Admission: EM | Admit: 2019-06-22 | Discharge: 2019-06-22 | Disposition: A | Discharge status: Home / Self Care | Payer: Self-pay | Attending: Emergency Medicine | Admitting: Emergency Medicine

## 2019-06-22 DIAGNOSIS — R071 Chest pain on breathing: Secondary | ICD-10-CM

## 2019-06-22 DIAGNOSIS — Z20828 Contact with and (suspected) exposure to other viral communicable diseases: Secondary | ICD-10-CM | POA: Diagnosis not present

## 2019-06-22 DIAGNOSIS — R0602 Shortness of breath: Secondary | ICD-10-CM

## 2019-06-22 DIAGNOSIS — J069 Acute upper respiratory infection, unspecified: Secondary | ICD-10-CM | POA: Diagnosis not present

## 2019-06-22 DIAGNOSIS — Z20822 Contact with and (suspected) exposure to covid-19: Secondary | ICD-10-CM

## 2019-06-22 NOTE — ED Triage Notes (Signed)
Pt presents with cold symptoms and sob since Monday

## 2019-06-22 NOTE — Discharge Instructions (Addendum)
Your EKG is normal  COVID testing ordered.  It will take between 2-7 days for test results.  Someone will contact you regarding abnormal results.    In the meantime: You should remain isolated in your home for 10 days from symptom onset  Get plenty of rest and push fluids Use medications daily for symptom relief Use OTC medications like ibuprofen or tylenol as needed fever or pain Call or go to the ED if you have any new or worsening symptoms such as fever, worsening cough, shortness of breath, chest tightness, chest pain, turning blue, changes in mental status, etc..Marland Kitchen
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
## 2019-06-27 LAB — NOVEL CORONAVIRUS, NAA: SARS-CoV-2, NAA: NOT DETECTED

## 2019-07-19 ENCOUNTER — Other Ambulatory Visit: Payer: Self-pay

## 2019-07-19 ENCOUNTER — Ambulatory Visit
Admission: EM | Admit: 2019-07-19 | Discharge: 2019-07-19 | Disposition: A | Discharge status: Home / Self Care | Payer: Self-pay | Attending: Emergency Medicine | Admitting: Emergency Medicine

## 2019-07-19 ENCOUNTER — Encounter: Payer: Self-pay | Admitting: Emergency Medicine

## 2019-07-19 DIAGNOSIS — B9689 Other specified bacterial agents as the cause of diseases classified elsewhere: Secondary | ICD-10-CM

## 2019-07-19 DIAGNOSIS — L089 Local infection of the skin and subcutaneous tissue, unspecified: Secondary | ICD-10-CM

## 2019-07-19 MED ORDER — TRIAMCINOLONE ACETONIDE 0.5 % EX OINT: 1.0000 "application " | TOPICAL_OINTMENT | Freq: Two times a day (BID) | CUTANEOUS | 0 refills | Status: DC

## 2019-07-19 NOTE — Discharge Instructions (Signed)
Keep area(s) clean and dry. Apply hot compress / towel for 5-10 minutes 3-5 times daily. Return for worsening pain, redness, swelling, discharge, fever.  Helpful prevention tips: Keep nails short to avoid secondary skin infections. Use new, clean razors when shaving. Avoid antiperspirants - look for deodorants without aluminum. Avoid wearing underwire bras as this can irritate the area further.  

## 2019-07-19 NOTE — ED Triage Notes (Signed)
Pt presents to [facility] for boils that have been popping up all over her body x 2-3 months.  She currently has one to her left inner thigh and her right abdomen, states she has noted purulent discharge and blood from them.
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
## 2019-11-03 ENCOUNTER — Emergency Department (HOSPITAL_COMMUNITY)
Admission: EM | Admit: 2019-11-03 | Discharge: 2019-11-03 | Disposition: A | Discharge status: Home / Self Care | Payer: Self-pay | Attending: Emergency Medicine | Admitting: Emergency Medicine

## 2019-11-03 ENCOUNTER — Emergency Department (HOSPITAL_COMMUNITY): Payer: Self-pay

## 2019-11-03 ENCOUNTER — Encounter (HOSPITAL_COMMUNITY): Payer: Self-pay | Admitting: *Deleted

## 2019-11-03 ENCOUNTER — Other Ambulatory Visit: Payer: Self-pay

## 2019-11-03 DIAGNOSIS — J45909 Unspecified asthma, uncomplicated: Secondary | ICD-10-CM | POA: Insufficient documentation

## 2019-11-03 DIAGNOSIS — W57XXXA Bitten or stung by nonvenomous insect and other nonvenomous arthropods, initial encounter: Secondary | ICD-10-CM

## 2019-11-03 DIAGNOSIS — R531 Weakness: Secondary | ICD-10-CM | POA: Insufficient documentation

## 2019-11-03 DIAGNOSIS — Y939 Activity, unspecified: Secondary | ICD-10-CM | POA: Insufficient documentation

## 2019-11-03 DIAGNOSIS — Z79899 Other long term (current) drug therapy: Secondary | ICD-10-CM | POA: Insufficient documentation

## 2019-11-03 DIAGNOSIS — Y999 Unspecified external cause status: Secondary | ICD-10-CM | POA: Insufficient documentation

## 2019-11-03 DIAGNOSIS — Y929 Unspecified place or not applicable: Secondary | ICD-10-CM | POA: Insufficient documentation

## 2019-11-03 DIAGNOSIS — R072 Precordial pain: Secondary | ICD-10-CM

## 2019-11-03 DIAGNOSIS — M791 Myalgia, unspecified site: Secondary | ICD-10-CM | POA: Insufficient documentation

## 2019-11-03 DIAGNOSIS — S80862A Insect bite (nonvenomous), left lower leg, initial encounter: Secondary | ICD-10-CM | POA: Insufficient documentation

## 2019-11-03 DIAGNOSIS — R52 Pain, unspecified: Secondary | ICD-10-CM

## 2019-11-03 DIAGNOSIS — Z87891 Personal history of nicotine dependence: Secondary | ICD-10-CM | POA: Insufficient documentation

## 2019-11-03 LAB — CBC WITH DIFFERENTIAL/PLATELET
Abs Immature Granulocytes: 0.04 10*3/uL (ref 0.00–0.07)
Basophils Absolute: 0 10*3/uL (ref 0.0–0.1)
Basophils Relative: 1 %
Eosinophils Absolute: 0.2 10*3/uL (ref 0.0–0.5)
Eosinophils Relative: 3 %
HCT: 43.7 % (ref 36.0–46.0)
Hemoglobin: 14.4 g/dL (ref 12.0–15.0)
Immature Granulocytes: 1 %
Lymphocytes Relative: 29 %
Lymphs Abs: 2.3 10*3/uL (ref 0.7–4.0)
MCH: 29.3 pg (ref 26.0–34.0)
MCHC: 33 g/dL (ref 30.0–36.0)
MCV: 89 fL (ref 80.0–100.0)
Monocytes Absolute: 0.5 10*3/uL (ref 0.1–1.0)
Monocytes Relative: 7 %
Neutro Abs: 4.9 10*3/uL (ref 1.7–7.7)
Neutrophils Relative %: 59 %
Platelets: 271 10*3/uL (ref 150–400)
RBC: 4.91 MIL/uL (ref 3.87–5.11)
RDW: 13.2 % (ref 11.5–15.5)
WBC: 8 10*3/uL (ref 4.0–10.5)
nRBC: 0 % (ref 0.0–0.2)

## 2019-11-03 LAB — COMPREHENSIVE METABOLIC PANEL
ALT: 17 U/L (ref 0–44)
AST: 17 U/L (ref 15–41)
Albumin: 3.9 g/dL (ref 3.5–5.0)
Alkaline Phosphatase: 69 U/L (ref 38–126)
Anion gap: 9 (ref 5–15)
BUN: 7 mg/dL (ref 6–20)
CO2: 26 mmol/L (ref 22–32)
Calcium: 8.8 mg/dL — ABNORMAL LOW (ref 8.9–10.3)
Chloride: 103 mmol/L (ref 98–111)
Creatinine, Ser: 0.65 mg/dL (ref 0.44–1.00)
GFR calc Af Amer: >60 mL/min (ref >60)
GFR calc non Af Amer: >60 mL/min (ref >60)
Glucose, Bld: 121 mg/dL — ABNORMAL HIGH (ref 70–99)
Potassium: 3.7 mmol/L (ref 3.5–5.1)
Sodium: 138 mmol/L (ref 135–145)
Total Bilirubin: 0.9 mg/dL (ref 0.3–1.2)
Total Protein: 7 g/dL (ref 6.5–8.1)

## 2019-11-03 LAB — URINALYSIS, ROUTINE W REFLEX MICROSCOPIC
Bilirubin Urine: NEGATIVE
Glucose, UA: NEGATIVE mg/dL
Hgb urine dipstick: NEGATIVE
Ketones, ur: NEGATIVE mg/dL
Leukocytes,Ua: NEGATIVE
Nitrite: NEGATIVE
Protein, ur: NEGATIVE mg/dL
Specific Gravity, Urine: 1.011 (ref 1.005–1.030)
pH: 7 (ref 5.0–8.0)

## 2019-11-03 LAB — TROPONIN I (HIGH SENSITIVITY): Troponin I (High Sensitivity): <2 ng/L (ref <18)

## 2019-11-03 LAB — PREGNANCY, URINE: Preg Test, Ur: NEGATIVE

## 2019-11-03 LAB — LIPASE, BLOOD: Lipase: 26 U/L (ref 11–51)

## 2019-11-03 MED ORDER — DOXYCYCLINE HYCLATE 100 MG PO CAPS
100.0000 mg | ORAL_CAPSULE | Freq: Two times a day (BID) | ORAL | 0 refills | Status: DC
Start: 2019-11-03 — End: 2022-12-28
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
## 2019-11-03 NOTE — ED Triage Notes (Signed)
Patient comes to the ED with chest pain, weakness and generalized pain after removing a tick off of her left calf one day ago.

## 2019-11-03 NOTE — ED Notes (Signed)
Pt reports "rib pain" and points to both R and L rib area   Reports she awakened this am with same   Has taken no OTC meds   Reports pain 7/10

## 2019-11-03 NOTE — Discharge Instructions (Addendum)
Take ibuprofen, 600 to 800 mg 3 times a day with food for body aches.  Drink plenty of fluids.  Your tick tests are still pending.  You may review your results in MyChart.  This may take 3 to 4 days.  Follow-up with your primary doctor for recheck.  Take the antibiotic as directed

## 2019-11-06 LAB — B. BURGDORFI ANTIBODIES: B burgdorferi Ab IgG+IgM: <0.91 {ISR} (ref 0.00–0.90)

## 2019-11-06 LAB — ROCKY MTN SPOTTED FVR ABS PNL(IGG+IGM)
RMSF IgG: NEGATIVE
RMSF IgM: 0.23 index (ref 0.00–0.89)

## 2020-05-22 ENCOUNTER — Other Ambulatory Visit: Payer: Self-pay

## 2020-05-22 ENCOUNTER — Encounter (HOSPITAL_COMMUNITY): Payer: Self-pay | Admitting: Emergency Medicine

## 2020-05-22 ENCOUNTER — Emergency Department (HOSPITAL_COMMUNITY)
Admission: EM | Admit: 2020-05-22 | Discharge: 2020-05-22 | Disposition: A | Discharge status: Home / Self Care | Payer: Self-pay | Attending: Emergency Medicine | Admitting: Emergency Medicine

## 2020-05-22 ENCOUNTER — Emergency Department (HOSPITAL_COMMUNITY): Payer: Self-pay

## 2020-05-22 DIAGNOSIS — Z79891 Long term (current) use of opiate analgesic: Secondary | ICD-10-CM | POA: Insufficient documentation

## 2020-05-22 DIAGNOSIS — E119 Type 2 diabetes mellitus without complications: Secondary | ICD-10-CM | POA: Insufficient documentation

## 2020-05-22 DIAGNOSIS — R102 Pelvic and perineal pain: Secondary | ICD-10-CM | POA: Insufficient documentation

## 2020-05-22 DIAGNOSIS — B373 Candidiasis of vulva and vagina: Secondary | ICD-10-CM | POA: Insufficient documentation

## 2020-05-22 DIAGNOSIS — B3731 Acute candidiasis of vulva and vagina: Secondary | ICD-10-CM

## 2020-05-22 LAB — CBC WITH DIFFERENTIAL/PLATELET
Abs Immature Granulocytes: 0.05 10*3/uL (ref 0.00–0.07)
Basophils Absolute: 0.1 10*3/uL (ref 0.0–0.1)
Basophils Relative: 1 %
Eosinophils Absolute: 0.3 10*3/uL (ref 0.0–0.5)
Eosinophils Relative: 3 %
HCT: 40.4 % (ref 36.0–46.0)
Hemoglobin: 13.8 g/dL (ref 12.0–15.0)
Immature Granulocytes: 1 %
Lymphocytes Relative: 26 %
Lymphs Abs: 2.5 10*3/uL (ref 0.7–4.0)
MCH: 29.2 pg (ref 26.0–34.0)
MCHC: 34.2 g/dL (ref 30.0–36.0)
MCV: 85.4 fL (ref 80.0–100.0)
Monocytes Absolute: 0.6 10*3/uL (ref 0.1–1.0)
Monocytes Relative: 6 %
Neutro Abs: 6.2 10*3/uL (ref 1.7–7.7)
Neutrophils Relative %: 63 %
Platelets: 252 10*3/uL (ref 150–400)
RBC: 4.73 MIL/uL (ref 3.87–5.11)
RDW: 12.8 % (ref 11.5–15.5)
WBC: 9.6 10*3/uL (ref 4.0–10.5)
nRBC: 0 % (ref 0.0–0.2)

## 2020-05-22 LAB — URINALYSIS, ROUTINE W REFLEX MICROSCOPIC
Bilirubin Urine: NEGATIVE
Glucose, UA: NEGATIVE mg/dL
Hgb urine dipstick: NEGATIVE
Ketones, ur: NEGATIVE mg/dL
Leukocytes,Ua: NEGATIVE
Nitrite: NEGATIVE
Protein, ur: NEGATIVE mg/dL
Specific Gravity, Urine: 1.017 (ref 1.005–1.030)
pH: 6 (ref 5.0–8.0)

## 2020-05-22 LAB — WET PREP, GENITAL
Clue Cells Wet Prep HPF POC: NONE SEEN
Sperm: NONE SEEN
Trich, Wet Prep: NONE SEEN

## 2020-05-22 LAB — COMPREHENSIVE METABOLIC PANEL
ALT: 26 U/L (ref 0–44)
AST: 18 U/L (ref 15–41)
Albumin: 3.9 g/dL (ref 3.5–5.0)
Alkaline Phosphatase: 64 U/L (ref 38–126)
Anion gap: 7 (ref 5–15)
BUN: 9 mg/dL (ref 6–20)
CO2: 23 mmol/L (ref 22–32)
Calcium: 8.5 mg/dL — ABNORMAL LOW (ref 8.9–10.3)
Chloride: 104 mmol/L (ref 98–111)
Creatinine, Ser: 0.64 mg/dL (ref 0.44–1.00)
GFR, Estimated: >60 mL/min (ref >60)
Glucose, Bld: 118 mg/dL — ABNORMAL HIGH (ref 70–99)
Potassium: 3.5 mmol/L (ref 3.5–5.1)
Sodium: 134 mmol/L — ABNORMAL LOW (ref 135–145)
Total Bilirubin: 1.3 mg/dL — ABNORMAL HIGH (ref 0.3–1.2)
Total Protein: 6.6 g/dL (ref 6.5–8.1)

## 2020-05-22 LAB — PREGNANCY, URINE: Preg Test, Ur: NEGATIVE

## 2020-05-22 MED ORDER — FLUCONAZOLE 200 MG PO TABS: 200.0000 mg | ORAL_TABLET | Freq: Once | ORAL | 0 refills | Status: AC

## 2020-05-22 NOTE — Discharge Instructions (Addendum)
Ultrasound today is unremarkable. Labs including CBC, CMP, UA, Upreg without acute findings. Wet prep shows yeast which is treated with Diflucan, sent to your pharmacy. Recommend follow up with your PCP.

## 2020-05-22 NOTE — ED Triage Notes (Signed)
Pt c/o of pelvic pain/pressure and episodes of incontinence x 2 days
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
## 2020-05-22 NOTE — ED Notes (Signed)
Pt transported to US

## 2020-09-09 ENCOUNTER — Emergency Department (HOSPITAL_COMMUNITY): Payer: Self-pay

## 2020-09-09 ENCOUNTER — Encounter (HOSPITAL_COMMUNITY): Payer: Self-pay

## 2020-09-09 ENCOUNTER — Emergency Department (HOSPITAL_COMMUNITY)
Admission: EM | Admit: 2020-09-09 | Discharge: 2020-09-09 | Disposition: A | Discharge status: Home / Self Care | Payer: Self-pay | Attending: Emergency Medicine | Admitting: Emergency Medicine

## 2020-09-09 ENCOUNTER — Other Ambulatory Visit: Payer: Self-pay

## 2020-09-09 DIAGNOSIS — R112 Nausea with vomiting, unspecified: Secondary | ICD-10-CM | POA: Insufficient documentation

## 2020-09-09 DIAGNOSIS — Z87891 Personal history of nicotine dependence: Secondary | ICD-10-CM | POA: Insufficient documentation

## 2020-09-09 DIAGNOSIS — J45909 Unspecified asthma, uncomplicated: Secondary | ICD-10-CM | POA: Insufficient documentation

## 2020-09-09 DIAGNOSIS — E119 Type 2 diabetes mellitus without complications: Secondary | ICD-10-CM | POA: Insufficient documentation

## 2020-09-09 DIAGNOSIS — R109 Unspecified abdominal pain: Secondary | ICD-10-CM | POA: Insufficient documentation

## 2020-09-09 DIAGNOSIS — R197 Diarrhea, unspecified: Secondary | ICD-10-CM | POA: Insufficient documentation

## 2020-09-09 LAB — COMPREHENSIVE METABOLIC PANEL
ALT: 42 U/L (ref 0–44)
AST: 26 U/L (ref 15–41)
Albumin: 4.7 g/dL (ref 3.5–5.0)
Alkaline Phosphatase: 72 U/L (ref 38–126)
Anion gap: 10 (ref 5–15)
BUN: 15 mg/dL (ref 6–20)
CO2: 24 mmol/L (ref 22–32)
Calcium: 9.8 mg/dL (ref 8.9–10.3)
Chloride: 104 mmol/L (ref 98–111)
Creatinine, Ser: 0.9 mg/dL (ref 0.44–1.00)
GFR, Estimated: >60 mL/min (ref >60)
Glucose, Bld: 132 mg/dL — ABNORMAL HIGH (ref 70–99)
Potassium: 3.9 mmol/L (ref 3.5–5.1)
Sodium: 138 mmol/L (ref 135–145)
Total Bilirubin: 1.6 mg/dL — ABNORMAL HIGH (ref 0.3–1.2)
Total Protein: 7.9 g/dL (ref 6.5–8.1)

## 2020-09-09 LAB — URINALYSIS, ROUTINE W REFLEX MICROSCOPIC
Bilirubin Urine: NEGATIVE
Glucose, UA: NEGATIVE mg/dL
Ketones, ur: 20 mg/dL — AB
Nitrite: NEGATIVE
Protein, ur: 100 mg/dL — AB
Specific Gravity, Urine: 1.018 (ref 1.005–1.030)
pH: 5 (ref 5.0–8.0)

## 2020-09-09 LAB — CBC
HCT: 50.3 % — ABNORMAL HIGH (ref 36.0–46.0)
Hemoglobin: 16.6 g/dL — ABNORMAL HIGH (ref 12.0–15.0)
MCH: 29.1 pg (ref 26.0–34.0)
MCHC: 33 g/dL (ref 30.0–36.0)
MCV: 88.1 fL (ref 80.0–100.0)
Platelets: 300 10*3/uL (ref 150–400)
RBC: 5.71 MIL/uL — ABNORMAL HIGH (ref 3.87–5.11)
RDW: 13.2 % (ref 11.5–15.5)
WBC: 16.1 10*3/uL — ABNORMAL HIGH (ref 4.0–10.5)
nRBC: 0 % (ref 0.0–0.2)

## 2020-09-09 LAB — LIPASE, BLOOD: Lipase: 27 U/L (ref 11–51)

## 2020-09-09 LAB — HCG, QUANTITATIVE, PREGNANCY: hCG, Beta Chain, Quant, S: <1 m[IU]/mL (ref <5)

## 2020-09-09 LAB — LACTIC ACID, PLASMA: Lactic Acid, Venous: 1.3 mmol/L (ref 0.5–1.9)

## 2020-09-09 LAB — CBG MONITORING, ED: Glucose-Capillary: 118 mg/dL — ABNORMAL HIGH (ref 70–99)

## 2020-09-09 MED ORDER — PANTOPRAZOLE SODIUM 40 MG IV SOLR
40.0000 mg | Freq: Once | INTRAVENOUS | Status: AC
  Administered 2020-09-09: 40 mg via INTRAVENOUS
  Filled 2020-09-09: qty 40

## 2020-09-09 MED ORDER — PROMETHAZINE HCL 25 MG/ML IJ SOLN
12.5000 mg | Freq: Once | INTRAMUSCULAR | Status: AC
  Administered 2020-09-09: 12.5 mg via INTRAVENOUS
  Filled 2020-09-09: qty 1

## 2020-09-09 MED ORDER — ALUM & MAG HYDROXIDE-SIMETH 200-200-20 MG/5ML PO SUSP
30.0000 mL | Freq: Once | ORAL | Status: AC
  Administered 2020-09-09: 30 mL via ORAL
  Filled 2020-09-09: qty 30

## 2020-09-09 MED ORDER — IOHEXOL 300 MG/ML  SOLN
100.0000 mL | Freq: Once | INTRAMUSCULAR | Status: AC | PRN
  Administered 2020-09-09: 100 mL via INTRAVENOUS

## 2020-09-09 MED ORDER — LIDOCAINE VISCOUS HCL 2 % MT SOLN
15.0000 mL | Freq: Once | OROMUCOSAL | Status: AC
  Administered 2020-09-09: 15 mL via ORAL
  Filled 2020-09-09: qty 15

## 2020-09-09 MED ORDER — MORPHINE SULFATE (PF) 4 MG/ML IV SOLN
4.0000 mg | Freq: Once | INTRAVENOUS | Status: AC
  Administered 2020-09-09: 4 mg via INTRAVENOUS
  Filled 2020-09-09: qty 1

## 2020-09-09 MED ORDER — ONDANSETRON HCL 4 MG/2ML IJ SOLN
4.0000 mg | Freq: Once | INTRAMUSCULAR | Status: AC
  Administered 2020-09-09: 4 mg via INTRAVENOUS
  Filled 2020-09-09: qty 2

## 2020-09-09 MED ORDER — PROMETHAZINE HCL 25 MG RE SUPP: 25.0000 mg | Freq: Four times a day (QID) | RECTAL | 0 refills | Status: DC | PRN

## 2020-09-09 MED ORDER — SODIUM CHLORIDE 0.9 % IV BOLUS
2000.0000 mL | Freq: Once | INTRAVENOUS | Status: AC
  Administered 2020-09-09: 2000 mL via INTRAVENOUS

## 2020-09-09 NOTE — ED Triage Notes (Addendum)
Pt presents to ED with complaints of vomiting since 0500, unable to keep any fluids down, states she tried zofran but unable to keep it down. Pt also with diarrhea.
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
## 2020-09-09 NOTE — Discharge Instructions (Addendum)
Labs and imaging without significant findings.  May take the dissolvable Zofran tablet.  Rest and drink plenty of fluids at home  Return for new or worsening symptoms

## 2020-09-09 NOTE — ED Notes (Signed)
Pt becoming agitated and irate in triage and states "Are all these questions necessary?" Informed pt must go through triage process, orders have been placed for patient until room becomes available.

## 2020-09-09 NOTE — ED Notes (Signed)
Patient to ultrasound at this time.

## 2020-09-11 LAB — URINE CULTURE

## 2020-09-14 LAB — CULTURE, BLOOD (ROUTINE X 2)
Culture: NO GROWTH
Culture: NO GROWTH
Special Requests: ADEQUATE

## 2020-10-23 ENCOUNTER — Other Ambulatory Visit: Payer: Self-pay

## 2020-10-23 ENCOUNTER — Ambulatory Visit (INDEPENDENT_AMBULATORY_CARE_PROVIDER_SITE_OTHER): Payer: Self-pay

## 2020-10-23 ENCOUNTER — Ambulatory Visit
Admission: EM | Admit: 2020-10-23 | Discharge: 2020-10-23 | Disposition: A | Discharge status: Home / Self Care | Payer: Self-pay | Attending: Internal Medicine | Admitting: Internal Medicine

## 2020-10-23 ENCOUNTER — Encounter: Payer: Self-pay | Admitting: Emergency Medicine

## 2020-10-23 DIAGNOSIS — M79672 Pain in left foot: Secondary | ICD-10-CM

## 2020-10-23 DIAGNOSIS — S90121A Contusion of right lesser toe(s) without damage to nail, initial encounter: Secondary | ICD-10-CM

## 2020-10-23 NOTE — ED Triage Notes (Signed)
Stumped toe yesterday, left pinky toe pain. States it hurts to bare weight on that foot.
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
## 2020-10-23 NOTE — Discharge Instructions (Addendum)
Wear post op shoe for 7-10 days and have a follow up with your primary care Dr. Next week

## 2020-11-20 ENCOUNTER — Emergency Department (HOSPITAL_COMMUNITY): Payer: Self-pay

## 2020-11-20 ENCOUNTER — Emergency Department (HOSPITAL_COMMUNITY)
Admission: EM | Admit: 2020-11-20 | Discharge: 2020-11-20 | Disposition: A | Discharge status: Home / Self Care | Payer: Self-pay | Attending: Emergency Medicine | Admitting: Emergency Medicine

## 2020-11-20 ENCOUNTER — Other Ambulatory Visit: Payer: Self-pay

## 2020-11-20 ENCOUNTER — Encounter (HOSPITAL_COMMUNITY): Payer: Self-pay

## 2020-11-20 DIAGNOSIS — R112 Nausea with vomiting, unspecified: Secondary | ICD-10-CM

## 2020-11-20 DIAGNOSIS — E119 Type 2 diabetes mellitus without complications: Secondary | ICD-10-CM | POA: Insufficient documentation

## 2020-11-20 DIAGNOSIS — K219 Gastro-esophageal reflux disease without esophagitis: Secondary | ICD-10-CM | POA: Insufficient documentation

## 2020-11-20 DIAGNOSIS — Z7951 Long term (current) use of inhaled steroids: Secondary | ICD-10-CM | POA: Insufficient documentation

## 2020-11-20 DIAGNOSIS — A084 Viral intestinal infection, unspecified: Secondary | ICD-10-CM | POA: Insufficient documentation

## 2020-11-20 DIAGNOSIS — Z87891 Personal history of nicotine dependence: Secondary | ICD-10-CM | POA: Insufficient documentation

## 2020-11-20 DIAGNOSIS — R197 Diarrhea, unspecified: Secondary | ICD-10-CM

## 2020-11-20 DIAGNOSIS — Z8719 Personal history of other diseases of the digestive system: Secondary | ICD-10-CM

## 2020-11-20 DIAGNOSIS — J45909 Unspecified asthma, uncomplicated: Secondary | ICD-10-CM | POA: Insufficient documentation

## 2020-11-20 HISTORY — DX: Diaphragmatic hernia without obstruction or gangrene: K44.9

## 2020-11-20 HISTORY — DX: Diverticulitis of intestine, part unspecified, without perforation or abscess without bleeding: K57.92

## 2020-11-20 LAB — URINALYSIS, ROUTINE W REFLEX MICROSCOPIC
Bilirubin Urine: NEGATIVE
Glucose, UA: NEGATIVE mg/dL
Hgb urine dipstick: NEGATIVE
Ketones, ur: NEGATIVE mg/dL
Leukocytes,Ua: NEGATIVE
Nitrite: NEGATIVE
Protein, ur: 30 mg/dL — AB
Specific Gravity, Urine: 1.025 (ref 1.005–1.030)
pH: 5 (ref 5.0–8.0)

## 2020-11-20 LAB — CBC WITH DIFFERENTIAL/PLATELET
Abs Immature Granulocytes: 0.04 10*3/uL (ref 0.00–0.07)
Basophils Absolute: 0.1 10*3/uL (ref 0.0–0.1)
Basophils Relative: 0 %
Eosinophils Absolute: 0 10*3/uL (ref 0.0–0.5)
Eosinophils Relative: 0 %
HCT: 49.4 % — ABNORMAL HIGH (ref 36.0–46.0)
Hemoglobin: 15.8 g/dL — ABNORMAL HIGH (ref 12.0–15.0)
Immature Granulocytes: 0 %
Lymphocytes Relative: 4 %
Lymphs Abs: 0.5 10*3/uL — ABNORMAL LOW (ref 0.7–4.0)
MCH: 28.7 pg (ref 26.0–34.0)
MCHC: 32 g/dL (ref 30.0–36.0)
MCV: 89.8 fL (ref 80.0–100.0)
Monocytes Absolute: 0.5 10*3/uL (ref 0.1–1.0)
Monocytes Relative: 4 %
Neutro Abs: 11.6 10*3/uL — ABNORMAL HIGH (ref 1.7–7.7)
Neutrophils Relative %: 92 %
Platelets: 269 10*3/uL (ref 150–400)
RBC: 5.5 MIL/uL — ABNORMAL HIGH (ref 3.87–5.11)
RDW: 13.1 % (ref 11.5–15.5)
WBC: 12.7 10*3/uL — ABNORMAL HIGH (ref 4.0–10.5)
nRBC: 0 % (ref 0.0–0.2)

## 2020-11-20 LAB — COMPREHENSIVE METABOLIC PANEL
ALT: 29 U/L (ref 0–44)
AST: 19 U/L (ref 15–41)
Albumin: 4 g/dL (ref 3.5–5.0)
Alkaline Phosphatase: 75 U/L (ref 38–126)
Anion gap: 8 (ref 5–15)
BUN: 17 mg/dL (ref 6–20)
CO2: 24 mmol/L (ref 22–32)
Calcium: 8.7 mg/dL — ABNORMAL LOW (ref 8.9–10.3)
Chloride: 104 mmol/L (ref 98–111)
Creatinine, Ser: 0.78 mg/dL (ref 0.44–1.00)
GFR, Estimated: >60 mL/min (ref >60)
Glucose, Bld: 117 mg/dL — ABNORMAL HIGH (ref 70–99)
Potassium: 4.1 mmol/L (ref 3.5–5.1)
Sodium: 136 mmol/L (ref 135–145)
Total Bilirubin: 1.4 mg/dL — ABNORMAL HIGH (ref 0.3–1.2)
Total Protein: 7.3 g/dL (ref 6.5–8.1)

## 2020-11-20 LAB — LIPASE, BLOOD: Lipase: 22 U/L (ref 11–51)

## 2020-11-20 LAB — HCG, QUANTITATIVE, PREGNANCY: hCG, Beta Chain, Quant, S: <1 m[IU]/mL (ref <5)

## 2020-11-20 LAB — LACTIC ACID, PLASMA: Lactic Acid, Venous: 1.1 mmol/L (ref 0.5–1.9)

## 2020-11-20 MED ORDER — SODIUM CHLORIDE 0.9 % IV BOLUS
1000.0000 mL | Freq: Once | INTRAVENOUS | Status: AC
  Administered 2020-11-20: 1000 mL via INTRAVENOUS

## 2020-11-20 MED ORDER — PANTOPRAZOLE SODIUM 40 MG IV SOLR
40.0000 mg | Freq: Once | INTRAVENOUS | Status: AC
  Administered 2020-11-20: 40 mg via INTRAVENOUS
  Filled 2020-11-20: qty 40

## 2020-11-20 MED ORDER — ONDANSETRON 4 MG PO TBDP
4.0000 mg | ORAL_TABLET | Freq: Once | ORAL | Status: AC
  Administered 2020-11-20: 4 mg via ORAL
  Filled 2020-11-20: qty 1

## 2020-11-20 MED ORDER — LOPERAMIDE HCL 2 MG PO CAPS: 2.0000 mg | ORAL_CAPSULE | Freq: Four times a day (QID) | ORAL | 0 refills | Status: DC | PRN

## 2020-11-20 MED ORDER — ONDANSETRON 4 MG PO TBDP: 4.0000 mg | ORAL_TABLET | Freq: Three times a day (TID) | ORAL | 0 refills | Status: DC | PRN

## 2020-11-20 MED ORDER — FENTANYL CITRATE (PF) 100 MCG/2ML IJ SOLN
50.0000 ug | Freq: Once | INTRAMUSCULAR | Status: AC
  Administered 2020-11-20: 50 ug via INTRAVENOUS
  Filled 2020-11-20: qty 2
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
## 2020-11-20 NOTE — ED Notes (Signed)
Pt transported to CT ?

## 2020-11-20 NOTE — ED Triage Notes (Signed)
Pt to er, pt states that since 5am she has been having abd pain, vomiting and diarrhea.  States that no one else at home is sick.  Pt states that she tried a phenergan suppository without relief.  Pt states that she has a hiatal hernia.

## 2020-11-20 NOTE — Discharge Instructions (Addendum)
Your lab tests and imaging today are reassuring with no evidence of intra abdominal surgical or emergent process. Your symptoms are most likely due to a viral stomach "bug" which should run its course, complicated by your acid reflux.  I recommend you taking your protonix daily for the next 2 weeks, then just as needed for symptom relief.  You have been prescribed zofran and imodium for nausea and diarrhea.  The imodium will also help stop your abdominal cramping.  Increase fluid intake and bland diet until your symptoms are better.    Call your primary MD for a recheck if your symptoms are not improving over the next 48 hours with this treatment plan.

## 2020-11-20 NOTE — ED Provider Notes (Signed)
MSE note.  Patient with nausea epigastric discomfort no vomiting.  Patient does complain of GERD symptoms.  Physical exam tenderness to periumbilical and epigastric area.  Labs and x-rays ordered along with Zofran   Bethann Berkshire, MD 11/20/20 1229

## 2021-01-21 ENCOUNTER — Emergency Department (HOSPITAL_COMMUNITY)
Admission: EM | Admit: 2021-01-21 | Discharge: 2021-01-21 | Disposition: A | Discharge status: Home / Self Care | Payer: Self-pay | Attending: Emergency Medicine | Admitting: Emergency Medicine

## 2021-01-21 ENCOUNTER — Encounter (HOSPITAL_COMMUNITY): Payer: Self-pay | Admitting: Emergency Medicine

## 2021-01-21 ENCOUNTER — Emergency Department (HOSPITAL_COMMUNITY): Payer: Self-pay

## 2021-01-21 ENCOUNTER — Other Ambulatory Visit: Payer: Self-pay

## 2021-01-21 DIAGNOSIS — S93401A Sprain of unspecified ligament of right ankle, initial encounter: Secondary | ICD-10-CM | POA: Insufficient documentation

## 2021-01-21 DIAGNOSIS — Y9301 Activity, walking, marching and hiking: Secondary | ICD-10-CM | POA: Insufficient documentation

## 2021-01-21 DIAGNOSIS — W108XXA Fall (on) (from) other stairs and steps, initial encounter: Secondary | ICD-10-CM | POA: Insufficient documentation

## 2021-01-21 DIAGNOSIS — Z87891 Personal history of nicotine dependence: Secondary | ICD-10-CM | POA: Insufficient documentation

## 2021-01-21 DIAGNOSIS — J45909 Unspecified asthma, uncomplicated: Secondary | ICD-10-CM | POA: Insufficient documentation

## 2021-01-21 DIAGNOSIS — Z7951 Long term (current) use of inhaled steroids: Secondary | ICD-10-CM | POA: Insufficient documentation

## 2021-01-21 MED ORDER — IBUPROFEN 800 MG PO TABS
800.0000 mg | ORAL_TABLET | Freq: Once | ORAL | Status: AC
  Administered 2021-01-21: 800 mg via ORAL
  Filled 2021-01-21: qty 1

## 2021-01-21 MED ORDER — IBUPROFEN 600 MG PO TABS: 600.0000 mg | ORAL_TABLET | Freq: Three times a day (TID) | ORAL | 0 refills | Status: DC

## 2021-01-21 NOTE — ED Notes (Signed)
Patient transported to X-ray 

## 2021-01-21 NOTE — ED Triage Notes (Signed)
Pt arrives via [facility] from home after fall. Pt c/o right ankle/foot pain.

## 2021-01-21 NOTE — Discharge Instructions (Addendum)
Wear the ASO and use crutches to avoid weight bearing.  Use ice and elevation as much as possible for the next several days to help reduce the swelling.  Take the ibuprofen prescribed for pain and swelling relief.    You will need to wear the ankle brace at all times when weight bearing to protect your ankle as it heals.  You should be re-evaluated by your primary MD in one week if you are unable to stop using the crutches (without increasing your pain or swelling) after the first week.  You may need to have physical therapy arranged if this is not healing as expected.    You may also contact Dr. Romeo Apple our local orthopedist for followup care if unable to get to your primary MD.
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
## 2021-08-19 ENCOUNTER — Encounter (HOSPITAL_COMMUNITY): Payer: Self-pay | Admitting: Emergency Medicine

## 2021-08-19 ENCOUNTER — Other Ambulatory Visit: Payer: Self-pay

## 2021-08-19 ENCOUNTER — Emergency Department (HOSPITAL_COMMUNITY)
Admission: EM | Admit: 2021-08-19 | Discharge: 2021-08-19 | Disposition: A | Discharge status: Home / Self Care | Payer: Self-pay | Attending: Emergency Medicine | Admitting: Emergency Medicine

## 2021-08-19 ENCOUNTER — Emergency Department (HOSPITAL_COMMUNITY): Payer: Self-pay

## 2021-08-19 ENCOUNTER — Emergency Department (HOSPITAL_COMMUNITY)
Admit: 2021-08-19 | Discharge: 2021-08-19 | Disposition: A | Discharge status: Home / Self Care | Payer: Self-pay | Attending: Emergency Medicine | Admitting: Emergency Medicine

## 2021-08-19 DIAGNOSIS — R000 Tachycardia, unspecified: Secondary | ICD-10-CM | POA: Insufficient documentation

## 2021-08-19 DIAGNOSIS — E119 Type 2 diabetes mellitus without complications: Secondary | ICD-10-CM | POA: Insufficient documentation

## 2021-08-19 DIAGNOSIS — K644 Residual hemorrhoidal skin tags: Secondary | ICD-10-CM | POA: Insufficient documentation

## 2021-08-19 DIAGNOSIS — R1032 Left lower quadrant pain: Secondary | ICD-10-CM | POA: Insufficient documentation

## 2021-08-19 DIAGNOSIS — K625 Hemorrhage of anus and rectum: Secondary | ICD-10-CM

## 2021-08-19 DIAGNOSIS — R110 Nausea: Secondary | ICD-10-CM | POA: Insufficient documentation

## 2021-08-19 LAB — POC OCCULT BLOOD, ED: Fecal Occult Bld: NEGATIVE

## 2021-08-19 LAB — COMPREHENSIVE METABOLIC PANEL
ALT: 37 U/L (ref 0–44)
AST: 30 U/L (ref 15–41)
Albumin: 4.5 g/dL (ref 3.5–5.0)
Alkaline Phosphatase: 73 U/L (ref 38–126)
Anion gap: 8 (ref 5–15)
BUN: 12 mg/dL (ref 6–20)
CO2: 25 mmol/L (ref 22–32)
Calcium: 9.4 mg/dL (ref 8.9–10.3)
Chloride: 104 mmol/L (ref 98–111)
Creatinine, Ser: 0.68 mg/dL (ref 0.44–1.00)
GFR, Estimated: >60 mL/min (ref >60)
Glucose, Bld: 123 mg/dL — ABNORMAL HIGH (ref 70–99)
Potassium: 3.6 mmol/L (ref 3.5–5.1)
Sodium: 137 mmol/L (ref 135–145)
Total Bilirubin: 1.5 mg/dL — ABNORMAL HIGH (ref 0.3–1.2)
Total Protein: 7.8 g/dL (ref 6.5–8.1)

## 2021-08-19 LAB — CBC WITH DIFFERENTIAL/PLATELET
Abs Immature Granulocytes: 0.05 10*3/uL (ref 0.00–0.07)
Basophils Absolute: 0.1 10*3/uL (ref 0.0–0.1)
Basophils Relative: 1 %
Eosinophils Absolute: 0.2 10*3/uL (ref 0.0–0.5)
Eosinophils Relative: 2 %
HCT: 44.7 % (ref 36.0–46.0)
Hemoglobin: 15.1 g/dL — ABNORMAL HIGH (ref 12.0–15.0)
Immature Granulocytes: 1 %
Lymphocytes Relative: 29 %
Lymphs Abs: 2.1 10*3/uL (ref 0.7–4.0)
MCH: 28.9 pg (ref 26.0–34.0)
MCHC: 33.8 g/dL (ref 30.0–36.0)
MCV: 85.5 fL (ref 80.0–100.0)
Monocytes Absolute: 0.6 10*3/uL (ref 0.1–1.0)
Monocytes Relative: 8 %
Neutro Abs: 4.3 10*3/uL (ref 1.7–7.7)
Neutrophils Relative %: 59 %
Platelets: 256 10*3/uL (ref 150–400)
RBC: 5.23 MIL/uL — ABNORMAL HIGH (ref 3.87–5.11)
RDW: 12.7 % (ref 11.5–15.5)
WBC: 7.3 10*3/uL (ref 4.0–10.5)
nRBC: 0 % (ref 0.0–0.2)

## 2021-08-19 LAB — LIPASE, BLOOD: Lipase: 30 U/L (ref 11–51)

## 2021-08-19 LAB — HCG, QUANTITATIVE, PREGNANCY: hCG, Beta Chain, Quant, S: <1 m[IU]/mL (ref <5)

## 2021-08-19 MED ORDER — MORPHINE SULFATE (PF) 4 MG/ML IV SOLN
4.0000 mg | Freq: Once | INTRAVENOUS | Status: AC
Start: 2021-08-19 — End: 2021-08-19
  Administered 2021-08-19: 4 mg via INTRAVENOUS
  Filled 2021-08-19: qty 1

## 2021-08-19 MED ORDER — HYDROCODONE-ACETAMINOPHEN 5-325 MG PO TABS
1.0000 | ORAL_TABLET | Freq: Four times a day (QID) | ORAL | 0 refills | Status: DC | PRN
Start: 2021-08-19 — End: 2022-12-28

## 2021-08-19 MED ORDER — ONDANSETRON HCL 4 MG/2ML IJ SOLN
4.0000 mg | Freq: Once | INTRAMUSCULAR | Status: AC
  Administered 2021-08-19: 4 mg via INTRAVENOUS
  Filled 2021-08-19: qty 2

## 2021-08-19 MED ORDER — SODIUM CHLORIDE 0.9 % IV BOLUS
1000.0000 mL | Freq: Once | INTRAVENOUS | Status: AC
  Administered 2021-08-19: 1000 mL via INTRAVENOUS

## 2021-08-19 MED ORDER — MORPHINE SULFATE (PF) 4 MG/ML IV SOLN
4.0000 mg | Freq: Once | INTRAVENOUS | Status: AC
  Administered 2021-08-19: 4 mg via INTRAVENOUS
  Filled 2021-08-19: qty 1

## 2021-08-19 MED ORDER — IOHEXOL 300 MG/ML  SOLN
100.0000 mL | Freq: Once | INTRAMUSCULAR | Status: AC | PRN
  Administered 2021-08-19: 100 mL via INTRAVENOUS

## 2021-08-19 MED ORDER — HYDROCORTISONE ACETATE 25 MG RE SUPP: 25.0000 mg | Freq: Two times a day (BID) | RECTAL | 0 refills | Status: DC

## 2021-08-19 NOTE — ED Notes (Signed)
ED Provider at bedside. 

## 2021-08-19 NOTE — ED Notes (Signed)
Pt transported by [facility] to [facility] for CT scan.

## 2021-08-19 NOTE — Discharge Instructions (Signed)
Your lab tests and your CT imaging today are normal and reassuring.  You are being prescribed a rectal suppository to treat possible internal hemorrhoids which could be the source of your bleeding as your CT does not suggest any other source such as diverticulitis or colitis.  You have also been prescribed a small quantity of hydrocodone.  Do not drive within 4 hours of taking this medication as it will make you drowsy.  Call Dr. Jenetta Downer our local gastroenterologist for an office visit to discuss your symptoms and further testing.

## 2021-08-19 NOTE — ED Notes (Signed)
Call to [facility] CT to notify of CT orders.  [facility] to be notified for transport

## 2021-08-19 NOTE — ED Provider Notes (Signed)
[facility]   CSN: [id number] Arrival date & time: 08/19/21  [id number]     History  Chief Complaint  Patient presents with   Rectal Bleeding    Allison DittyChristina Dalton is a 31 y.o. female with a history including fibromyalgia and associated chronic pain, acid reflux disease, type 2 diabetes, IBS, history of diverticulitis, surgical history including cholecystectomy presenting for evaluation of rectal bleeding which has been present for approximately 2 weeks. She describes constant cramping pain in her suprapubic and left lower quadrant which started yesterday.  She also endorses nausea without emesis.  She has had no fevers or chills.  Although she has a history of hemorrhoids, she does not feel they are acting up at this time and is a source of her bleeding.  She describes intermittently passing blood clots along with dark blood with bowel movements, not every time but this has been increasing, has had 2 episodes daily for the past several days.  She presents with pictures on her phone showing small blood clots on toilet tissue and clots and blood in the toilet bowl, moderate, but not completely obscuring the toilet water.  Of note, review of chart indicates pt was diagnosed here with epiploic appendagitis  Oct 2018, NOT diverticulitis.  According to outside records she was seen by her PCP with [facility] on February 1 for evaluation of bright red blood at which time her lab tests were reassuring, she deferred on rectal exam despite having history of hemorrhoids, she does not feel this bleeding is hemorrhoid related.  She has been referred to gastroenterology for colonoscopy which she is scheduled for the end of April.    The history is provided by the patient.      Home Medications Prior to Admission medications   Medication Sig Start Date End Date Taking? Authorizing Provider  albuterol (VENTOLIN HFA) 108 (90 Base) MCG/ACT inhaler Inhale 2 puffs into the  lungs every 6 (six) hours as needed for wheezing or shortness of breath. 10/08/17 08/19/21 Yes [provider]  fluticasone (FLOVENT HFA) 110 MCG/ACT inhaler Inhale 1 puff into the lungs daily as needed (SOB/Wheezing). 10/08/17 08/19/21 Yes [provider]  gabapentin (NEURONTIN) 300 MG capsule Take 300 mg by mouth daily as needed (nerve pain). 07/07/21  Yes [provider]  HYDROcodone-acetaminophen (NORCO/VICODIN) 5-325 MG tablet Take 1 tablet by mouth every 6 (six) hours as needed. 08/19/21  Yes Cassidey Barrales, Raynelle FanningJulie, PA-C  hydrocortisone (ANUSOL-HC) 25 MG suppository Place 1 suppository (25 mg total) rectally 2 (two) times daily. 08/19/21  Yes IdolRaynelle Fanning, Hadessah Grennan, PA-C  methylphenidate 27 MG PO TB24 Take 27 mg by mouth daily. 04/08/21  Yes [provider]  pantoprazole (PROTONIX) 40 MG tablet Take 40 mg by mouth daily.   Yes [provider]  doxycycline (VIBRAMYCIN) 100 MG capsule Take 1 capsule (100 mg total) by mouth 2 (two) times daily. Patient not taking: Reported on 09/09/2020 11/03/19   Triplett, Tammy, PA-C  fexofenadine-pseudoephedrine (ALLEGRA-D) 60-120 MG 12 hr tablet Take 1 tablet by mouth 2 (two) times daily. Patient not taking: Reported on 09/09/2020 04/07/19   Joni ReiningSmith, Ronald K, PA-C  ibuprofen (ADVIL) 600 MG tablet Take 1 tablet (600 mg total) by mouth 3 (three) times daily. Patient not taking: Reported on 08/19/2021 01/21/21   Burgess AmorIdol, Belia Febo, PA-C  lidocaine (XYLOCAINE) 2 % solution Use as directed 5 mLs in the mouth or throat every 6 (six) hours as needed for mouth pain. For oral swish and swallow. Patient  not taking: Reported on 11/03/2019 04/07/19   Joni Reining, PA-C  loperamide (IMODIUM) 2 MG capsule Take 1 capsule (2 mg total) by mouth 4 (four) times daily as needed for diarrhea or loose stools. Patient not taking: Reported on 08/19/2021 11/20/20   Burgess Amor, PA-C  methylPREDNISolone (MEDROL DOSEPAK) 4 MG TBPK tablet Take Tapered dose as directed Patient not taking:  Reported on 11/03/2019 04/07/19   Joni Reining, PA-C  ondansetron (ZOFRAN ODT) 4 MG disintegrating tablet Take 1 tablet (4 mg total) by mouth every 8 (eight) hours as needed for nausea or vomiting. Patient not taking: Reported on 08/19/2021 11/20/20   Burgess Amor, PA-C  ondansetron (ZOFRAN) 4 MG tablet Take 1 tablet (4 mg total) by mouth every 6 (six) hours. Patient not taking: Reported on 09/09/2020 02/01/19   Pauline Aus, PA-C  promethazine (PHENERGAN) 25 MG suppository Place 1 suppository (25 mg total) rectally every 6 (six) hours as needed for nausea or vomiting. Patient not taking: Reported on 08/19/2021 09/09/20   Henderly, Britni A, PA-C  triamcinolone ointment (KENALOG) 0.5 % Apply 1 application topically 2 (two) times daily. Patient not taking: Reported on 11/03/2019 07/19/19   Hall-Potvin, [place], PA-C      Allergies    Penicillins, Amoxicillin-pot clavulanate, Clindamycin/lincomycin, Cocoa, Pecan extract allergy skin test, Gentamicin, and Other    Review of Systems   Review of Systems  Constitutional:  Positive for fatigue. Negative for chills and fever.  HENT:  Negative for congestion.   Eyes: Negative.   Respiratory:  Negative for chest tightness and shortness of breath.   Cardiovascular:  Negative for chest pain.  Gastrointestinal:  Positive for abdominal pain, blood in stool and nausea. Negative for constipation, diarrhea, rectal pain and vomiting.  Genitourinary: Negative.   Musculoskeletal:  Negative for arthralgias, joint swelling and neck pain.  Skin: Negative.  Negative for rash and wound.  Neurological:  Negative for dizziness, weakness, light-headedness, numbness and headaches.  Psychiatric/Behavioral: Negative.    All other systems reviewed and are negative.  Physical Exam Updated Vital Signs BP 114/64    Pulse 80    Temp 98.8 F (37.1 C) (Oral)    Resp 16    Ht 5\' 3"  (1.6 m)    Wt 90.7 kg    LMP 07/23/2021 (Approximate)    SpO2 99%    BMI 35.42 kg/m  Physical  Exam Vitals and nursing note reviewed. Exam conducted with a chaperone present.  Constitutional:      Appearance: She is well-developed.  HENT:     Head: Normocephalic and atraumatic.  Eyes:     Conjunctiva/sclera: Conjunctivae normal.  Cardiovascular:     Rate and Rhythm: Regular rhythm. Tachycardia present.     Heart sounds: Normal heart sounds.  Pulmonary:     Effort: Pulmonary effort is normal.     Breath sounds: Normal breath sounds. No wheezing.  Abdominal:     General: Bowel sounds are normal.     Palpations: Abdomen is soft.     Tenderness: There is abdominal tenderness in the left lower quadrant. There is no guarding or rebound.  Genitourinary:    Comments: Small external hemorrhoid, nontender nonthrombosed.  Patient is Hemoccult negative.  There are no obvious internal hemorrhoids appreciated. Musculoskeletal:        General: Normal range of motion.     Cervical back: Normal range of motion.  Skin:    General: Skin is warm and dry.  Neurological:     Mental Status:  She is alert.    ED Results / Procedures / Treatments   Labs (all labs ordered are listed, but only abnormal results are displayed) Labs Reviewed  CBC WITH DIFFERENTIAL/PLATELET - Abnormal; Notable for the following components:      Result Value   RBC 5.23 (*)    Hemoglobin 15.1 (*)    All other components within normal limits  COMPREHENSIVE METABOLIC PANEL - Abnormal; Notable for the following components:   Glucose, Bld 123 (*)    Total Bilirubin 1.5 (*)    All other components within normal limits  LIPASE, BLOOD  HCG, QUANTITATIVE, PREGNANCY  POC OCCULT BLOOD, ED    EKG None  Radiology CT ABDOMEN PELVIS W CONTRAST  Result Date: 08/19/2021 CLINICAL DATA:  Rectal bleeding for 6 days, left lower quadrant pain EXAM: CT ABDOMEN AND PELVIS WITH CONTRAST TECHNIQUE: Multidetector CT imaging of the abdomen and pelvis was performed using the standard protocol following bolus administration of  intravenous contrast. RADIATION DOSE REDUCTION: This exam was performed according to the departmental dose-optimization program which includes automated exposure control, adjustment of the mA and/or kV according to patient size and/or use of iterative reconstruction technique. CONTRAST:  OMNIPAQUE IOHEXOL 300 MG/ML  SOLN COMPARISON:  08/19/2021, 11/20/2020 FINDINGS: Lower chest: No acute pleural or parenchymal lung disease. Hepatobiliary: Diffuse hepatic steatosis. No focal liver abnormality. The gallbladder is surgically absent. Pancreas: Unremarkable. No pancreatic ductal dilatation or surrounding inflammatory changes. Spleen: Normal in size without focal abnormality. Adrenals/Urinary Tract: Stable punctate 3 mm nonobstructing calculus lower pole left kidney. No right-sided calculi. No obstructive uropathy within either kidney. Bladder is minimally distended with no gross abnormalities. The adrenals are normal. Stomach/Bowel: No bowel obstruction or ileus. Normal appendix right lower quadrant. No bowel wall thickening or inflammatory change. Vascular/Lymphatic: No significant vascular findings are present. No enlarged abdominal or pelvic lymph nodes. Reproductive: Uterus and bilateral adnexa are unremarkable. Other: No free fluid or free gas.  No abdominal wall hernia. Musculoskeletal: No acute or destructive bony lesions. Reconstructed images demonstrate no additional findings. IMPRESSION: 1. Stable 3 mm nonobstructing left renal calculus. 2. Hepatic steatosis. 3. Otherwise no acute intra-abdominal or intrapelvic process. Electronically Signed   By: Sharlet Salina M.D.   On: 08/19/2021 17:50   DG ABD ACUTE 2+V W 1V CHEST  Result Date: 08/19/2021 CLINICAL DATA:  Lower abdominal pain EXAM: DG ABDOMEN ACUTE WITH 1 VIEW CHEST COMPARISON:  Chest x-ray dated Nov 25, 2016 FINDINGS: There is no evidence of dilated bowel loops or free intraperitoneal air. Calcification overlying the expected area of the lower  pole of the left kidney measuring approximately 4 mm. Calcification overlying the expected area of the upper pole of the right kidney measuring 8 mm. Heart size and mediastinal contours are within normal limits. Both lungs are clear. IMPRESSION: 1. Nonobstructive bowel gas pattern. No acute cardiopulmonary disease. 2. Bilateral calcifications, concerning for nephrolithiasis. Electronically Signed   By: Allegra Lai M.D.   On: 08/19/2021 12:42    Procedures Procedures    Medications Ordered in ED Medications  morphine (PF) 4 MG/ML injection 4 mg (4 mg Intravenous Given 08/19/21 1057)  ondansetron (ZOFRAN) injection 4 mg (4 mg Intravenous Given 08/19/21 1056)  sodium chloride 0.9 % bolus 1,000 mL (0 mLs Intravenous Stopped 08/19/21 1148)  morphine (PF) 4 MG/ML injection 4 mg (4 mg Intravenous Given 08/19/21 1500)  ondansetron (ZOFRAN) injection 4 mg (4 mg Intravenous Given 08/19/21 1500)    ED Course/ Medical Decision Making/ A&P  Medical Decision Making Patient with complaints of intermittent rectal bleeding along with left lower quadrant abdominal pain, currently awaiting gastroenterology evaluation, was referred to a GI specialist at [facility], unfortunately cannot be seen until the end of April.  Having persistent worsening symptoms, including blood clots per rectum with bowel movements.  CT imaging today is negative for acute intra-abdominal process.  Her labs are reassuring with a hemoglobin of 15.1.  She is Hemoccult negative by our exam today.  Amount and/or Complexity of Data Reviewed External Data Reviewed: labs.    Details: External labs reviewed from a PCP visit that she had on February 1.  Her hemoglobin at that time was 15.5. Labs: ordered.    Details: Today's labs were reviewed, hemoglobin is stable.  She is Hemoccult negative. Radiology: ordered.    Details: CT imaging reviewed, which was preceded by acute abdominal series which shows no evidence for bowel  obstruction.  Her CT was also negative for acute findings.  Risk Prescription drug management. Risk Details: Left lower quadrant abdominal pain with rectal bleeding.  She does have a small external hemorrhoid which is not thrombosed and nontender, this does not appear to be the source of her bleeding.  Possible she has an internal hemorrhoid that is bleeding, she does endorse having this diagnosis from a prior colonoscopy 5 years ago.  Patient is placed on Anusol suppositories, will plan referral to local GI for hopeful expedient follow-up care.            Final Clinical Impression(s) / ED Diagnoses Final diagnoses:  Rectal bleeding  Left lower quadrant abdominal pain    Rx / DC Orders ED Discharge Orders          Ordered    hydrocortisone (ANUSOL-HC) 25 MG suppository  2 times daily        08/19/21 1827    HYDROcodone-acetaminophen (NORCO/VICODIN) 5-325 MG tablet  Every 6 hours PRN        08/19/21 1827              Burgess Amor, PA-C 08/19/21 1831    Vanetta Mulders, MD 08/20/21 [phone]

## 2021-08-19 NOTE — ED Notes (Signed)
Pt returned from CT at [facility]

## 2021-08-19 NOTE — ED Triage Notes (Signed)
Pt to the ED with complaints of rectal bleeding for the past six days.  Pt states she does have hemorrhoids, but she also has abdominal pain at this time.

## 2021-11-28 ENCOUNTER — Ambulatory Visit
Admission: EM | Admit: 2021-11-28 | Discharge: 2021-11-28 | Disposition: A | Discharge status: Home / Self Care | Payer: Self-pay

## 2021-11-28 DIAGNOSIS — J069 Acute upper respiratory infection, unspecified: Secondary | ICD-10-CM | POA: Insufficient documentation

## 2021-11-28 LAB — POCT RAPID STREP A (OFFICE): Rapid Strep A Screen: NEGATIVE

## 2021-11-28 MED ORDER — PROMETHAZINE-DM 6.25-15 MG/5ML PO SYRP: 5.0000 mL | ORAL_SOLUTION | Freq: Four times a day (QID) | ORAL | 0 refills | Status: DC | PRN

## 2021-11-28 NOTE — ED Triage Notes (Signed)
Pt states she started having a sore throat and congestion yesterday  Pt states she is dizzy and feeling weak  Pt states she thinks she has had a fever  Denies Meds
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
## 2021-11-29 LAB — COVID-19, FLU A+B NAA
Influenza A, NAA: NOT DETECTED
Influenza B, NAA: NOT DETECTED
SARS-CoV-2, NAA: NOT DETECTED

## 2021-12-01 LAB — CULTURE, GROUP A STREP (THRC)

## 2021-12-20 ENCOUNTER — Encounter (HOSPITAL_COMMUNITY): Payer: Self-pay | Admitting: *Deleted

## 2021-12-20 ENCOUNTER — Emergency Department (HOSPITAL_COMMUNITY)
Admission: EM | Admit: 2021-12-20 | Discharge: 2021-12-20 | Disposition: A | Discharge status: Home / Self Care | Payer: Self-pay | Attending: Emergency Medicine | Admitting: Emergency Medicine

## 2021-12-20 ENCOUNTER — Other Ambulatory Visit: Payer: Self-pay

## 2021-12-20 DIAGNOSIS — H1031 Unspecified acute conjunctivitis, right eye: Secondary | ICD-10-CM | POA: Insufficient documentation

## 2021-12-20 MED ORDER — TOBRAMYCIN 0.3 % OP SOLN: 1.0000 [drp] | OPHTHALMIC | 0 refills | Status: AC

## 2021-12-20 MED ORDER — FLUORESCEIN SODIUM 1 MG OP STRP
1.0000 | ORAL_STRIP | Freq: Once | OPHTHALMIC | Status: AC
  Administered 2021-12-20: 1 via OPHTHALMIC
  Filled 2021-12-20: qty 1

## 2021-12-20 MED ORDER — TETRACAINE HCL 0.5 % OP SOLN
1.0000 [drp] | Freq: Once | OPHTHALMIC | Status: AC
  Administered 2021-12-20: 1 [drp] via OPHTHALMIC
  Filled 2021-12-20: qty 4

## 2021-12-20 NOTE — ED Provider Notes (Signed)
[facility]GENCY DEPARTMENT Provider Note   CSN: [id number] Arrival date & time: 12/20/21  [id number]     History  Chief Complaint  Patient presents with   Eye Pain    Allison Dalton is a 31 y.o. female.  Pt complains of redness and tearing to her right eye.  Pt reports she has been using otc eye drops for redness.  Pt reports some congestion.  Pt reports she thought it was irritation from smoke in air.  Pt wears contact.  She has not had in for 2 days   The history is provided by the patient. No language interpreter was used.  Eye Pain This is a new problem. The current episode started 2 days ago. The problem has been gradually worsening. Nothing aggravates the symptoms. Nothing relieves the symptoms. She has tried nothing for the symptoms.       Home Medications Prior to Admission medications   Medication Sig Start Date End Date Taking? Authorizing Provider  tobramycin (TOBREX) 0.3 % ophthalmic solution Place 1 drop into the right eye every 4 (four) hours for 10 days. 12/20/21 12/30/21 Yes Elson AreasSofia, Damiah Mcdonald K, PA-C  albuterol (VENTOLIN HFA) 108 (90 Base) MCG/ACT inhaler Inhale 2 puffs into the lungs every 6 (six) hours as needed for wheezing or shortness of breath. 10/08/17 08/19/21  [provider]  citalopram (CELEXA) 10 MG tablet Take 1 tablet by mouth daily. 11/26/21 11/26/22  [provider]  doxycycline (VIBRAMYCIN) 100 MG capsule Take 1 capsule (100 mg total) by mouth 2 (two) times daily. Patient not taking: Reported on 09/09/2020 11/03/19   Triplett, Tammy, PA-C  fexofenadine-pseudoephedrine (ALLEGRA-D) 60-120 MG 12 hr tablet Take 1 tablet by mouth 2 (two) times daily. Patient not taking: Reported on 09/09/2020 04/07/19   Joni ReiningSmith, Ronald K, PA-C  fluticasone (FLOVENT HFA) 110 MCG/ACT inhaler Inhale 1 puff into the lungs daily as needed (SOB/Wheezing). 10/08/17 08/19/21  [provider]  gabapentin (NEURONTIN) 300 MG capsule Take 300 mg by mouth daily as needed  (nerve pain). 07/07/21   [provider]  HYDROcodone-acetaminophen (NORCO/VICODIN) 5-325 MG tablet Take 1 tablet by mouth every 6 (six) hours as needed. 08/19/21   Burgess AmorIdol, Julie, PA-C  hydrocortisone (ANUSOL-HC) 25 MG suppository Place 1 suppository (25 mg total) rectally 2 (two) times daily. 08/19/21   Burgess AmorIdol, Julie, PA-C  ibuprofen (ADVIL) 600 MG tablet Take 1 tablet (600 mg total) by mouth 3 (three) times daily. Patient not taking: Reported on 08/19/2021 01/21/21   Burgess AmorIdol, Julie, PA-C  lidocaine (XYLOCAINE) 2 % solution Use as directed 5 mLs in the mouth or throat every 6 (six) hours as needed for mouth pain. For oral swish and swallow. Patient not taking: Reported on 11/03/2019 04/07/19   Joni ReiningSmith, Ronald K, PA-C  loperamide (IMODIUM) 2 MG capsule Take 1 capsule (2 mg total) by mouth 4 (four) times daily as needed for diarrhea or loose stools. Patient not taking: Reported on 08/19/2021 11/20/20   Burgess AmorIdol, Julie, PA-C  LORazepam (ATIVAN) 0.5 MG tablet Take by mouth. 11/26/21   [provider]  methylphenidate 27 MG PO TB24 Take 27 mg by mouth daily. 04/08/21   [provider]  methylPREDNISolone (MEDROL DOSEPAK) 4 MG TBPK tablet Take Tapered dose as directed Patient not taking: Reported on 11/03/2019 04/07/19   Joni ReiningSmith, Ronald K, PA-C  ondansetron (ZOFRAN ODT) 4 MG disintegrating tablet Take 1 tablet (4 mg total) by mouth every 8 (eight) hours as needed for nausea or vomiting. Patient not taking: Reported on  08/19/2021 11/20/20   Burgess Amor, PA-C  ondansetron (ZOFRAN) 4 MG tablet Take 1 tablet (4 mg total) by mouth every 6 (six) hours. Patient not taking: Reported on 09/09/2020 02/01/19   Triplett, Tammy, PA-C  pantoprazole (PROTONIX) 40 MG tablet Take 40 mg by mouth daily.    [provider]  promethazine (PHENERGAN) 25 MG suppository Place 1 suppository (25 mg total) rectally every 6 (six) hours as needed for nausea or vomiting. Patient not taking: Reported on 08/19/2021 09/09/20   Henderly,  Britni A, PA-C  promethazine-dextromethorphan (PROMETHAZINE-DM) 6.25-15 MG/5ML syrup Take 5 mLs by mouth 4 (four) times daily as needed. 11/28/21   Particia Nearing, PA-C  triamcinolone ointment (KENALOG) 0.5 % Apply 1 application topically 2 (two) times daily. Patient not taking: Reported on 11/03/2019 07/19/19   Hall-Potvin, [place], PA-C      Allergies    Penicillins, Amoxicillin-pot clavulanate, Clindamycin/lincomycin, Cocoa, Pecan extract allergy skin test, Gentamicin, and Other    Review of Systems   Review of Systems  Eyes:  Positive for pain.  All other systems reviewed and are negative.   Physical Exam Updated Vital Signs BP 114/75   Pulse 73   Temp 97.6 F (36.4 C) (Oral)   Ht 5\' 3"  (1.6 m)   Wt 93.4 kg   LMP 10/24/2021 (Exact Date)   SpO2 100%   BMI 36.49 kg/m  Physical Exam Vitals and nursing note reviewed.  Constitutional:      Appearance: She is well-developed.  HENT:     Head: Normocephalic and atraumatic.     Mouth/Throat:     Mouth: Mucous membranes are moist.  Eyes:     Extraocular Movements: Extraocular movements intact.     Pupils: Pupils are equal, round, and reactive to light.     Comments: Injected right conjunctiva, tearing,  fluroscein no uptake, no foreign body  Pulmonary:     Effort: Pulmonary effort is normal.  Abdominal:     General: There is no distension.  Musculoskeletal:        General: Normal range of motion.  Skin:    General: Skin is warm.  Neurological:     General: No focal deficit present.     Mental Status: She is alert and oriented to person, place, and time.  Psychiatric:        Mood and Affect: Mood normal.     ED Results / Procedures / Treatments   Labs (all labs ordered are listed, but only abnormal results are displayed) Labs Reviewed - No data to display  EKG None  Radiology No results found.  Procedures Procedures    Medications Ordered in ED Medications  fluorescein ophthalmic strip 1 strip (1  strip Right Eye Given 12/20/21 0934)  tetracaine (PONTOCAINE) 0.5 % ophthalmic solution 1 drop (1 drop Right Eye Given 12/20/21 0941)    ED Course/ Medical Decision Making/ A&P                           Medical Decision Making Risk Prescription drug management.   Pt given rx for tobrex.  Pt given referral to Opthalmology if symptoms persist         Final Clinical Impression(s) / ED Diagnoses Final diagnoses:  Acute conjunctivitis of right eye, unspecified acute conjunctivitis type    Rx / DC Orders ED Discharge Orders          Ordered    tobramycin (TOBREX) 0.3 % ophthalmic solution  Every 4 hours        12/20/21 1012           An After Visit Summary was printed and given to the patient.    Elson Areas, PA-C 12/20/21 1025    Horton, Clabe Seal, DO 12/21/21 [id number]

## 2021-12-20 NOTE — ED Triage Notes (Signed)
Pt c/o right eye pain and tearing x one day

## 2021-12-20 NOTE — Discharge Instructions (Addendum)
Return if any problems.  Follow up with Opthalmology if symptoms persist

## 2021-12-20 NOTE — ED Notes (Signed)
Visual acuity done. Patient has removed contact from right eye due to the pain and drainage. Left eye still corrected with contact.   Both 20/15 Left 20/15 Right 20/70

## 2021-12-24 ENCOUNTER — Encounter (HOSPITAL_COMMUNITY): Payer: Self-pay

## 2021-12-24 ENCOUNTER — Other Ambulatory Visit: Payer: Self-pay

## 2021-12-24 ENCOUNTER — Emergency Department (HOSPITAL_COMMUNITY): Payer: Self-pay

## 2021-12-24 ENCOUNTER — Emergency Department (HOSPITAL_COMMUNITY)
Admission: EM | Admit: 2021-12-24 | Discharge: 2021-12-24 | Disposition: A | Discharge status: Home / Self Care | Payer: Self-pay | Attending: Student | Admitting: Student

## 2021-12-24 DIAGNOSIS — J45909 Unspecified asthma, uncomplicated: Secondary | ICD-10-CM | POA: Insufficient documentation

## 2021-12-24 DIAGNOSIS — R58 Hemorrhage, not elsewhere classified: Secondary | ICD-10-CM

## 2021-12-24 DIAGNOSIS — J069 Acute upper respiratory infection, unspecified: Secondary | ICD-10-CM | POA: Insufficient documentation

## 2021-12-24 DIAGNOSIS — R079 Chest pain, unspecified: Secondary | ICD-10-CM | POA: Insufficient documentation

## 2021-12-24 DIAGNOSIS — K625 Hemorrhage of anus and rectum: Secondary | ICD-10-CM | POA: Insufficient documentation

## 2021-12-24 DIAGNOSIS — J029 Acute pharyngitis, unspecified: Secondary | ICD-10-CM

## 2021-12-24 DIAGNOSIS — Z20822 Contact with and (suspected) exposure to covid-19: Secondary | ICD-10-CM | POA: Insufficient documentation

## 2021-12-24 DIAGNOSIS — R1033 Periumbilical pain: Secondary | ICD-10-CM | POA: Insufficient documentation

## 2021-12-24 LAB — CBC WITH DIFFERENTIAL/PLATELET
Abs Immature Granulocytes: 0.02 10*3/uL (ref 0.00–0.07)
Basophils Absolute: 0.1 10*3/uL (ref 0.0–0.1)
Basophils Relative: 1 %
Eosinophils Absolute: 0.1 10*3/uL (ref 0.0–0.5)
Eosinophils Relative: 1 %
HCT: 44.8 % (ref 36.0–46.0)
Hemoglobin: 15 g/dL (ref 12.0–15.0)
Immature Granulocytes: 0 %
Lymphocytes Relative: 23 %
Lymphs Abs: 1.8 10*3/uL (ref 0.7–4.0)
MCH: 29.2 pg (ref 26.0–34.0)
MCHC: 33.5 g/dL (ref 30.0–36.0)
MCV: 87.2 fL (ref 80.0–100.0)
Monocytes Absolute: 0.5 10*3/uL (ref 0.1–1.0)
Monocytes Relative: 7 %
Neutro Abs: 5.1 10*3/uL (ref 1.7–7.7)
Neutrophils Relative %: 68 %
Platelets: 245 10*3/uL (ref 150–400)
RBC: 5.14 MIL/uL — ABNORMAL HIGH (ref 3.87–5.11)
RDW: 12.8 % (ref 11.5–15.5)
WBC: 7.5 10*3/uL (ref 4.0–10.5)
nRBC: 0 % (ref 0.0–0.2)

## 2021-12-24 LAB — TROPONIN I (HIGH SENSITIVITY)
Troponin I (High Sensitivity): <2 ng/L (ref <18)
Troponin I (High Sensitivity): <2 ng/L (ref <18)

## 2021-12-24 LAB — COMPREHENSIVE METABOLIC PANEL
ALT: 41 U/L (ref 0–44)
AST: 28 U/L (ref 15–41)
Albumin: 4.4 g/dL (ref 3.5–5.0)
Alkaline Phosphatase: 72 U/L (ref 38–126)
Anion gap: 7 (ref 5–15)
BUN: 8 mg/dL (ref 6–20)
CO2: 26 mmol/L (ref 22–32)
Calcium: 8.9 mg/dL (ref 8.9–10.3)
Chloride: 105 mmol/L (ref 98–111)
Creatinine, Ser: 0.75 mg/dL (ref 0.44–1.00)
GFR, Estimated: >60 mL/min (ref >60)
Glucose, Bld: 98 mg/dL (ref 70–99)
Potassium: 3.5 mmol/L (ref 3.5–5.1)
Sodium: 138 mmol/L (ref 135–145)
Total Bilirubin: 1.4 mg/dL — ABNORMAL HIGH (ref 0.3–1.2)
Total Protein: 7.5 g/dL (ref 6.5–8.1)

## 2021-12-24 LAB — URINALYSIS, ROUTINE W REFLEX MICROSCOPIC
Bilirubin Urine: NEGATIVE
Glucose, UA: NEGATIVE mg/dL
Hgb urine dipstick: NEGATIVE
Ketones, ur: NEGATIVE mg/dL
Leukocytes,Ua: NEGATIVE
Nitrite: NEGATIVE
Protein, ur: 30 mg/dL — AB
Specific Gravity, Urine: 1.025 (ref 1.005–1.030)
pH: 5 (ref 5.0–8.0)

## 2021-12-24 LAB — GROUP A STREP BY PCR: Group A Strep by PCR: NOT DETECTED

## 2021-12-24 LAB — LIPASE, BLOOD: Lipase: 25 U/L (ref 11–51)

## 2021-12-24 LAB — TYPE AND SCREEN
ABO/RH(D): O POS
Antibody Screen: NEGATIVE

## 2021-12-24 LAB — RESP PANEL BY RT-PCR (FLU A&B, COVID) ARPGX2
Influenza A by PCR: NEGATIVE
Influenza B by PCR: NEGATIVE
SARS Coronavirus 2 by RT PCR: NEGATIVE

## 2021-12-24 LAB — HCG, QUANTITATIVE, PREGNANCY: hCG, Beta Chain, Quant, S: <1 m[IU]/mL (ref <5)

## 2021-12-24 MED ORDER — DICYCLOMINE HCL 20 MG PO TABS: 20.0000 mg | ORAL_TABLET | Freq: Two times a day (BID) | ORAL | 0 refills | Status: DC

## 2021-12-24 MED ORDER — IPRATROPIUM-ALBUTEROL 0.5-2.5 (3) MG/3ML IN SOLN: 3.0000 mL | Freq: Four times a day (QID) | RESPIRATORY_TRACT | 0 refills | Status: AC | PRN

## 2021-12-24 MED ORDER — IPRATROPIUM-ALBUTEROL 0.5-2.5 (3) MG/3ML IN SOLN
3.0000 mL | Freq: Once | RESPIRATORY_TRACT | Status: AC
  Administered 2021-12-24: 3 mL via RESPIRATORY_TRACT
  Filled 2021-12-24: qty 3

## 2021-12-24 MED ORDER — IOHEXOL 300 MG/ML  SOLN
100.0000 mL | Freq: Once | INTRAMUSCULAR | Status: AC | PRN
  Administered 2021-12-24: 100 mL via INTRAVENOUS

## 2021-12-24 MED ORDER — DICYCLOMINE HCL 10 MG PO CAPS
10.0000 mg | ORAL_CAPSULE | Freq: Once | ORAL | Status: AC
  Administered 2021-12-24: 10 mg via ORAL
  Filled 2021-12-24: qty 1

## 2021-12-24 NOTE — ED Provider Notes (Signed)
[facility] EMERGENCY DEPARTMENT Provider Note   CSN: [id number] Arrival date & time: 12/24/21  [id number]     History Chief Complaint  Patient presents with   URI    Allison Dalton is a 31 y.o. female with history of anxiety, asthma, chronic pain, depression, diverticulitis, mood disorder, hemorrhoids presents the emergency department for multiple complaints.  Initially, the patient presents for her cough and cold symptoms.  She reports that when she was discharged on 12-20-2021 for conjunctivitis, she started to have a sore throat, cough, nasal congestion, and rhinorrhea since then.  She reports that she has tried Flonase without much relief.  Additionally, she mentions she has been having chest pain and shortness of breath for the past 2 days is intermittent.  She reports the chest pain is central and does not radiate anywhere.  Furthermore, the patient is presenting for rectal bleeding/hematochezia.  The patient's been seen multiple times this year for the syncope and had a colonoscopy done in April 2023 that showed internal and external hemorrhoids that recommended banding.  Patient reports that she has periumbilical pain, which she has had for months but has been intermittent past 2 weeks, and reports that this is a "flare" and would like an MRI to rule out any ulcerative colitis or Crohn's disease.  She reports that she does not agree with her previous gastroenterologist and is looking for a second opinion which she is going to another GI doctor in August 2023.  The patient deformity that she was post to report to her previous GI provider if she were to have continued rectal bleeding but has not followed up with them. She denies any fevers, dysuria, hematuria, vomiting, constipation.  She does mention some occasional nausea with soft stools.  She reports she has some generalized body aches with fatigue as well since her cough and cold symptoms   URI Presenting symptoms: congestion, cough,  fatigue, rhinorrhea and sore throat   Presenting symptoms: no fever   Associated symptoms: myalgias   Associated symptoms: no headaches        Home Medications Prior to Admission medications   Medication Sig Start Date End Date Taking? Authorizing Provider  albuterol (VENTOLIN HFA) 108 (90 Base) MCG/ACT inhaler Inhale 2 puffs into the lungs every 6 (six) hours as needed for wheezing or shortness of breath. 10/08/17 08/19/21  [provider]  citalopram (CELEXA) 10 MG tablet Take 1 tablet by mouth daily. 11/26/21 11/26/22  [provider]  doxycycline (VIBRAMYCIN) 100 MG capsule Take 1 capsule (100 mg total) by mouth 2 (two) times daily. Patient not taking: Reported on 09/09/2020 11/03/19   Triplett, Tammy, PA-C  fexofenadine-pseudoephedrine (ALLEGRA-D) 60-120 MG 12 hr tablet Take 1 tablet by mouth 2 (two) times daily. Patient not taking: Reported on 09/09/2020 04/07/19   Sable Feil, PA-C  fluticasone (FLOVENT HFA) 110 MCG/ACT inhaler Inhale 1 puff into the lungs daily as needed (SOB/Wheezing). 10/08/17 08/19/21  [provider]  gabapentin (NEURONTIN) 300 MG capsule Take 300 mg by mouth daily as needed (nerve pain). 07/07/21   [provider]  HYDROcodone-acetaminophen (NORCO/VICODIN) 5-325 MG tablet Take 1 tablet by mouth every 6 (six) hours as needed. 08/19/21   Evalee Jefferson, PA-C  hydrocortisone (ANUSOL-HC) 25 MG suppository Place 1 suppository (25 mg total) rectally 2 (two) times daily. 08/19/21   Evalee Jefferson, PA-C  ibuprofen (ADVIL) 600 MG tablet Take 1 tablet (600 mg total) by mouth 3 (three) times daily. Patient not taking: Reported on 08/19/2021 01/21/21  Evalee Jefferson, PA-C  lidocaine (XYLOCAINE) 2 % solution Use as directed 5 mLs in the mouth or throat every 6 (six) hours as needed for mouth pain. For oral swish and swallow. Patient not taking: Reported on 11/03/2019 04/07/19   Sable Feil, PA-C  loperamide (IMODIUM) 2 MG capsule Take 1 capsule (2 mg total) by  mouth 4 (four) times daily as needed for diarrhea or loose stools. Patient not taking: Reported on 08/19/2021 11/20/20   Evalee Jefferson, PA-C  LORazepam (ATIVAN) 0.5 MG tablet Take by mouth. 11/26/21   [provider]  methylphenidate 27 MG PO TB24 Take 27 mg by mouth daily. 04/08/21   [provider]  methylPREDNISolone (MEDROL DOSEPAK) 4 MG TBPK tablet Take Tapered dose as directed Patient not taking: Reported on 11/03/2019 04/07/19   Sable Feil, PA-C  ondansetron (ZOFRAN ODT) 4 MG disintegrating tablet Take 1 tablet (4 mg total) by mouth every 8 (eight) hours as needed for nausea or vomiting. Patient not taking: Reported on 08/19/2021 11/20/20   Evalee Jefferson, PA-C  ondansetron (ZOFRAN) 4 MG tablet Take 1 tablet (4 mg total) by mouth every 6 (six) hours. Patient not taking: Reported on 09/09/2020 02/01/19   Triplett, Tammy, PA-C  pantoprazole (PROTONIX) 40 MG tablet Take 40 mg by mouth daily.    [provider]  promethazine (PHENERGAN) 25 MG suppository Place 1 suppository (25 mg total) rectally every 6 (six) hours as needed for nausea or vomiting. Patient not taking: Reported on 08/19/2021 09/09/20   Henderly, Britni A, PA-C  promethazine-dextromethorphan (PROMETHAZINE-DM) 6.25-15 MG/5ML syrup Take 5 mLs by mouth 4 (four) times daily as needed. 11/28/21   Volney American, PA-C  tobramycin (TOBREX) 0.3 % ophthalmic solution Place 1 drop into the right eye every 4 (four) hours for 10 days. 12/20/21 12/30/21  Fransico Meadow, PA-C  triamcinolone ointment (KENALOG) 0.5 % Apply 1 application topically 2 (two) times daily. Patient not taking: Reported on 11/03/2019 07/19/19   Hall-Potvin, [place], PA-C      Allergies    Penicillins, Amoxicillin-pot clavulanate, Clindamycin/lincomycin, Cocoa, Pecan extract allergy skin test, Gentamicin, and Other    Review of Systems   Review of Systems  Constitutional:  Positive for fatigue. Negative for chills and fever.  HENT:  Positive for  congestion, rhinorrhea and sore throat.   Respiratory:  Positive for cough and shortness of breath.   Cardiovascular:  Positive for chest pain.  Gastrointestinal:  Positive for abdominal pain, blood in stool, diarrhea and nausea. Negative for constipation and vomiting.  Genitourinary:  Negative for dysuria and hematuria.  Musculoskeletal:  Positive for myalgias.  Neurological:  Negative for syncope and headaches.    Physical Exam Updated Vital Signs BP 131/89 (BP Location: Right Arm)   Pulse 80   Temp 98 F (36.7 C) (Oral)   Resp 20   Ht 5\' 3"  (1.6 m)   Wt 93.4 kg   LMP 12/09/2021 (Exact Date)   SpO2 98%   BMI 36.49 kg/m  Physical Exam Vitals and nursing note reviewed.  Constitutional:      General: She is not in acute distress.    Appearance: Normal appearance. She is not ill-appearing or toxic-appearing.  HENT:     Head: Normocephalic and atraumatic.     Right Ear: Tympanic membrane, ear canal and external ear normal.     Left Ear: Tympanic membrane, ear canal and external ear normal.     Ears:     Comments: Present in bilateral ear  canals, however eardrum is still visible.  Normal.  No bulging or erythema or purulence noted.  No mastoid tenderness or swelling noted.    Nose:     Comments: Bilateral nasal turbinate edema and erythema with scant clear nasal discharge.    Mouth/Throat:     Mouth: Mucous membranes are moist.     Comments: No pharyngeal erythema, exudate, or edema noted.  Uvula midline.  Airway patent.  Moist mucous membranes. Eyes:     General: No scleral icterus.    Conjunctiva/sclera: Conjunctivae normal.  Cardiovascular:     Rate and Rhythm: Normal rate and regular rhythm.  Pulmonary:     Effort: Pulmonary effort is normal. No respiratory distress.     Breath sounds: Normal breath sounds.     Comments: Clear to auscultation bilaterally.  No respiratory distress, sensory muscle use, nasal flaring, cyanosis, or tripoding present.  Patient speaking full  sentences with ease and satting well on room air without increased work of breathing. Chest:     Chest wall: No tenderness.  Abdominal:     General: Bowel sounds are normal.     Palpations: Abdomen is soft.     Tenderness: There is abdominal tenderness. There is no guarding or rebound.     Comments: Soft, NBS. Diffuse abdominal tenderness without any guarding or rebound.  Normal active bowel sounds.  Genitourinary:    Comments: Patient declined a GU and/or rectal exam Musculoskeletal:        General: No deformity.     Cervical back: Normal range of motion.  Skin:    General: Skin is warm and dry.     Findings: No rash.  Neurological:     General: No focal deficit present.     Mental Status: She is alert. Mental status is at baseline.     ED Results / Procedures / Treatments   Labs (all labs ordered are listed, but only abnormal results are displayed) Labs Reviewed  CBC WITH DIFFERENTIAL/PLATELET - Abnormal; Notable for the following components:      Result Value   RBC 5.14 (*)    All other components within normal limits  COMPREHENSIVE METABOLIC PANEL - Abnormal; Notable for the following components:   Total Bilirubin 1.4 (*)    All other components within normal limits  URINALYSIS, ROUTINE W REFLEX MICROSCOPIC - Abnormal; Notable for the following components:   APPearance HAZY (*)    Protein, ur 30 (*)    Bacteria, UA RARE (*)    All other components within normal limits  GROUP A STREP BY PCR  RESP PANEL BY RT-PCR (FLU A&B, COVID) ARPGX2  HCG, QUANTITATIVE, PREGNANCY  LIPASE, BLOOD  TYPE AND SCREEN  TROPONIN I (HIGH SENSITIVITY)  TROPONIN I (HIGH SENSITIVITY)    EKG None  Radiology CT ABDOMEN PELVIS W CONTRAST  Result Date: 12/24/2021 CLINICAL DATA:  Abdominal pain, bright red rectal bleeding EXAM: CT ABDOMEN AND PELVIS WITH CONTRAST TECHNIQUE: Multidetector CT imaging of the abdomen and pelvis was performed using the standard protocol following bolus  administration of intravenous contrast. RADIATION DOSE REDUCTION: This exam was performed according to the departmental dose-optimization program which includes automated exposure control, adjustment of the mA and/or kV according to patient size and/or use of iterative reconstruction technique. CONTRAST:  159mL OMNIPAQUE IOHEXOL 300 MG/ML  SOLN COMPARISON:  CT abdomen and pelvis dated August 19, 2021 FINDINGS: Lower chest: No acute abnormality. Hepatobiliary: Hepatic steatosis. No suspicious liver lesions. Gallbladder is surgically absent. No biliary ductal dilation.  Pancreas: Unremarkable. No pancreatic ductal dilatation or surrounding inflammatory changes. Spleen: Normal in size without focal abnormality. Adrenals/Urinary Tract: Bilateral adrenal glands are unremarkable. No hydronephrosis or nephrolithiasis. Nonobstructing stone of the lower pole the left kidney. Bladder is unremarkable. Stomach/Bowel: Stomach is within normal limits. Appendix appears normal. No evidence of bowel wall thickening, distention, or inflammatory changes. Vascular/Lymphatic: No significant vascular findings are present. No enlarged abdominal or pelvic lymph nodes. Reproductive: Uterus is unremarkable. Left adnexal cyst measuring 2.3 cm, likely physiologic with no further follow-up imaging needed. Other: No abdominal wall hernia or abnormality. No abdominopelvic ascites. Musculoskeletal: No acute or significant osseous findings. IMPRESSION: 1. No acute findings in the abdomen or pelvis. 2. Stable nonobstructing left renal calculus. 3. Hepatic steatosis. Electronically Signed   By: Yetta Glassman M.D.   On: 12/24/2021 13:00   DG Chest 2 View  Result Date: 12/24/2021 CLINICAL DATA:  31 year old female with cough, URI symptoms x4 days. Bright red blood per rectum. EXAM: CHEST - 2 VIEW COMPARISON:  Chest radiographs 10/14/2021 and earlier. FINDINGS: Normal lung volumes and mediastinal contours. Visualized tracheal air column is within  normal limits. Both lungs appear clear. No pneumothorax or pleural effusion. No acute osseous abnormality identified. Negative visible bowel gas. IMPRESSION: Negative.  No cardiopulmonary abnormality. Electronically Signed   By: Genevie Ann M.D.   On: 12/24/2021 11:14    Procedures Procedures   Medications Ordered in ED Medications  ipratropium-albuterol (DUONEB) 0.5-2.5 (3) MG/3ML nebulizer solution 3 mL (3 mLs Nebulization Given 12/24/21 1153)  dicyclomine (BENTYL) capsule 10 mg (10 mg Oral Given 12/24/21 1128)  iohexol (OMNIPAQUE) 300 MG/ML solution 100 mL (100 mLs Intravenous Contrast Given 12/24/21 1229)    ED Course/ Medical Decision Making/ A&P                           Medical Decision Making Amount and/or Complexity of Data Reviewed Labs: ordered. Radiology: ordered.  Risk Prescription drug management.   31 year old female presents the emergency department for evaluation of multiple complaints.  Differential is broad including viral illness, COVID, flu, strep throat, mono, bronchitis, pneumonia, pneumothorax, ACS, dissection, aneurysm, colitis, diverticulitis, hemorrhoids, IBD.  Vital signs are unremarkable.  Patient normotensive, afebrile, normal pulse rate, satting well room air without increased work of breathing.  Physical exam is pertinent for well-appearing no acute distress patient.  She does have some nasal congestion present.  No pharyngeal erythema, edema, or exudate noted.  No cervical lymphadenopathy.  Her abdomen is soft but diffusely tender without any guarding or rebound.  Normal active bowel sounds.  Regular rate and rhythm.  Lungs are clear to auscultation.  The patient has been seen here multiple times this year for her complaints of rectal bleeding.  She had a colonoscopy done on 11/03/21 for this prior blood per rectum.  There were external and internal hemorrhoids visualized.  They recommended sitz bath's and hydrocortisone cream.  Hemorrhoid banding was recommended if  symptoms did not relieve themselves.  I discussed this with the patient and discussed with her this is likely just her hemorrhoids.  She reports that hemorrhoids do not cause this much bleeding and that this is a "flare".  She is adamant that she has ulcerative colitis or Crohn's that was missed or not diagnosed by her previous colonoscopy a few months ago.  She is seeking second opinion for another GI provider.  She is requesting an MRI of her abdomen, which I discussed is not routinely  done in the emergency department and she can follow-up outpatient.  She would like a CT exam.  I had a shared decision-making with the patient about the radiation that he can receive from CT scans can lead to an increased risk of cancer if they are not needed.  Patient would like to accept the risk and receive a CT scan for "peace of mind".  We will order basic labs and COVID and flu and strep for her cough and cold symptoms as well as a chest x-ray.  Will order EKG and ACS work-up as well.  I independently reviewed and interpreted the patient's labs.  Urinalysis shows hazy urine with 30 protein and rare bacteria.  There is some 6-10 blood cells seen, likely from her anus.  Troponin at less than 2 with repeat at less than 2.  Lipase at 25.  Negative for COVID and flu.  Negative for strep.  hCG negative.  CMP shows mildly elevated total bili at 1.4 otherwise no electrolyte or LFT abnormality.  X-ray shows no cardiopulmonary abnormality.  CT of the abdomen and pelvis shows no acute findings in the abdomen or pelvis.  There is stable nonobstructing left renal calculi.  Hepatic steatosis.  Is also a left adnexal cyst measuring up to 2.3 cm is likely physiologic and no further up following imaging is needed.  EKG shows sinus rhythm.  I suspect the bleeding is from hemorrhoids, although the patient will not allow me to look at her bottom or vagina to determine source of blood.  I cannot see if she has any thrombosed hemorrhoids or  any actively bleeding hemorrhoids.  Cannot perform POC occult.  I discussed with the patient her lab and imaging findings.  I discussed with her this is less likely any cardiac involvement giving her negative troponins and reassuring EKG.  I discussed with her that her chest pains likely from her cough or for her asthma.  Additionally with her shortness of breath.  Patient is PERC negative, doubt any PE.  She is likely having shortness breath from her asthma exacerbation.  She reports that her shortness of breath improved after the DuoNeb treatment she received here.  We will send her home with additional DuoNeb treatments. Will also send her home on a some Bentyl as well. The patient is following with her PCP next week.  Advised her to keep that appointment.  Given her unchanged abdominal CT, I recommended that she still follow-up with her gastroenterologist either her new bone or the old one to discuss her continued rectal bleeding.  I discussed with her that she can call to be placed on the cancellation list with her GI to possibly be seen sooner.  We discussed sinus rinses and continued use of her Flonase.  Strict return precautions and red flag symptoms were discussed.  Patient verbalizes understanding and agrees to plan.  Patient is stable and being discharged home in good condition.  I discussed this case with my attending physician who cosigned this note including patient's presenting symptoms, physical exam, and planned diagnostics and interventions. Attending physician stated agreement with plan or made changes to plan which were implemented.   Final Clinical Impression(s) / ED Diagnoses Final diagnoses:  Bleeding  Periumbilical abdominal pain  Viral URI with cough  Sore throat  Chest pain, unspecified type    Rx / DC Orders ED Discharge Orders          Ordered    ipratropium-albuterol (DUONEB) 0.5-2.5 (3) MG/3ML SOLN  Every 6 hours PRN        12/24/21 1453    dicyclomine (BENTYL) 20  MG tablet  2 times daily        12/24/21 1453              Sherrell Puller, [place] 12/24/21 [place], [place], MD 12/26/21 [id number]

## 2021-12-24 NOTE — Discharge Instructions (Addendum)
You were seen in the ED for evaluation of your abdominal pain, rectal bleeding, cough, cold symptoms, chest pain, and shortness of breath.  Your lab and imaging were unremarkable.  We have ruled out any inflammation such as diverticulitis or colitis of your intestines, any heart attacks.  Your lungs sounded normal.  Your chest x-ray was reassuring.  This is likely a viral illness.  I will send you home with a few of those DuoNeb nebulizer ampules as you found relief with that.  Please continue use your albuterol inhaler.  Additionally, I am sending you home with the Bentyl you received while here to help with your abdominal pain. Please make sure you follow-up with your PCP at your scheduled appointment next week.  If you have any worsening abdominal pain, worsening bleeding, fevers, worsening chest pain, shortness of breath, inability to move your neck, please return to the nearest emergency department for evaluation.  Contact a doctor if: Your chest pain does not go away. You feel depressed. You have a fever. Get help right away if: Your chest pain is worse. You have a cough that gets worse, or you cough up blood. You have very bad (severe) pain in your belly (abdomen). You pass out (faint). You have either of these for no clear reason: Sudden chest discomfort. Sudden discomfort in your arms, back, neck, or jaw. You have shortness of breath at any time. You suddenly start to sweat, or your skin gets clammy. You feel sick to your stomach (nauseous). You throw up (vomit). You suddenly feel lightheaded or dizzy. You feel very weak or tired. Your heart starts to beat fast, or it feels like it is skipping beats. These symptoms may be an emergency. Do not wait to see if the symptoms will go away. Get medical help right away. Call your local emergency services (911 in the [place] Do not drive yourself to the hospital.

## 2021-12-24 NOTE — ED Triage Notes (Signed)
Patient with complaints of URI symptoms that started on 6/10. Also, has complaints of bright red rectal bleeding since January and upper left leg pain for 2 days.

## 2022-06-18 DIAGNOSIS — F909 Attention-deficit hyperactivity disorder, unspecified type: Secondary | ICD-10-CM | POA: Diagnosis not present

## 2022-06-18 DIAGNOSIS — F331 Major depressive disorder, recurrent, moderate: Secondary | ICD-10-CM | POA: Diagnosis not present

## 2022-06-18 DIAGNOSIS — F4312 Post-traumatic stress disorder, chronic: Secondary | ICD-10-CM | POA: Diagnosis not present

## 2022-08-14 ENCOUNTER — Encounter: Payer: Self-pay | Admitting: Emergency Medicine

## 2022-08-14 ENCOUNTER — Ambulatory Visit
Admission: EM | Admit: 2022-08-14 | Discharge: 2022-08-14 | Disposition: A | Discharge status: Home / Self Care | Payer: Medicaid Other | Attending: Nurse Practitioner | Admitting: Nurse Practitioner

## 2022-08-14 ENCOUNTER — Other Ambulatory Visit: Payer: Self-pay

## 2022-08-14 DIAGNOSIS — J029 Acute pharyngitis, unspecified: Secondary | ICD-10-CM | POA: Insufficient documentation

## 2022-08-14 DIAGNOSIS — J069 Acute upper respiratory infection, unspecified: Secondary | ICD-10-CM | POA: Insufficient documentation

## 2022-08-14 DIAGNOSIS — Z1152 Encounter for screening for COVID-19: Secondary | ICD-10-CM | POA: Diagnosis not present

## 2022-08-14 LAB — POCT RAPID STREP A (OFFICE): Rapid Strep A Screen: NEGATIVE

## 2022-08-14 MED ORDER — PROMETHAZINE-DM 6.25-15 MG/5ML PO SYRP: 5.0000 mL | ORAL_SOLUTION | Freq: Four times a day (QID) | ORAL | 0 refills | Status: DC | PRN

## 2022-08-14 MED ORDER — LIDOCAINE VISCOUS HCL 2 % MT SOLN: 5.0000 mL | Freq: Four times a day (QID) | OROMUCOSAL | 0 refills | Status: DC | PRN

## 2022-08-14 NOTE — ED Triage Notes (Signed)
Pt reports generalized body aches, sore throat, runny nose since yesterday. Intermittent chills, denies any known fevers.

## 2022-08-14 NOTE — Discharge Instructions (Addendum)
The rapid strep test is negative.  A throat culture and COVID test are pending.  If the pending results are positive, you will be contacted.  If your COVID test result was positive, you are candidate to receive Paxlovid if you choose. Increase fluids and allow for plenty of rest. May continue over-the-counter Tylenol or ibuprofen as needed for pain, fever, or general discomfort. Warm salt water gargles 3-4 times daily while throat symptoms persist. Recommend a soft diet while throat symptoms persist. Continue use of your albuterol inhaler as needed for shortness of breath. If your symptoms worsen or fail to improve, please follow-up with your primary care physician or in this clinic for further evaluation. Follow-up as needed.
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
## 2022-08-15 LAB — SARS CORONAVIRUS 2 (TAT 6-24 HRS): SARS Coronavirus 2: POSITIVE — AB

## 2022-08-16 ENCOUNTER — Telehealth: Payer: Self-pay | Admitting: Emergency Medicine

## 2022-08-16 LAB — CULTURE, GROUP A STREP (THRC)

## 2022-08-16 MED ORDER — PAXLOVID (300/100) 20 X 150 MG & 10 X 100MG PO TBPK: 3.0000 | ORAL_TABLET | Freq: Two times a day (BID) | ORAL | 0 refills | Status: AC

## 2022-08-16 NOTE — Telephone Encounter (Signed)
Patient tested covid positive.  Paxlovid sent into [facility] for patient

## 2022-08-17 ENCOUNTER — Telehealth (HOSPITAL_COMMUNITY): Payer: Self-pay | Admitting: Emergency Medicine

## 2022-08-17 NOTE — Telephone Encounter (Signed)
Opened in error

## 2022-09-14 DIAGNOSIS — E119 Type 2 diabetes mellitus without complications: Secondary | ICD-10-CM | POA: Diagnosis not present

## 2022-09-17 DIAGNOSIS — F331 Major depressive disorder, recurrent, moderate: Secondary | ICD-10-CM | POA: Diagnosis not present

## 2022-09-17 DIAGNOSIS — F909 Attention-deficit hyperactivity disorder, unspecified type: Secondary | ICD-10-CM | POA: Diagnosis not present

## 2022-09-17 DIAGNOSIS — F4312 Post-traumatic stress disorder, chronic: Secondary | ICD-10-CM | POA: Diagnosis not present

## 2022-10-26 DIAGNOSIS — R599 Enlarged lymph nodes, unspecified: Secondary | ICD-10-CM | POA: Diagnosis not present

## 2022-10-26 DIAGNOSIS — E119 Type 2 diabetes mellitus without complications: Secondary | ICD-10-CM | POA: Diagnosis not present

## 2022-10-26 DIAGNOSIS — E049 Nontoxic goiter, unspecified: Secondary | ICD-10-CM | POA: Diagnosis not present

## 2022-11-02 DIAGNOSIS — E049 Nontoxic goiter, unspecified: Secondary | ICD-10-CM | POA: Diagnosis not present

## 2022-12-12 ENCOUNTER — Encounter (HOSPITAL_COMMUNITY): Payer: Self-pay

## 2022-12-12 ENCOUNTER — Emergency Department (HOSPITAL_COMMUNITY)
Admission: EM | Admit: 2022-12-12 | Discharge: 2022-12-12 | Disposition: A | Discharge status: Home / Self Care | Payer: Medicaid Other | Attending: Student | Admitting: Student

## 2022-12-12 ENCOUNTER — Other Ambulatory Visit: Payer: Self-pay

## 2022-12-12 ENCOUNTER — Emergency Department (HOSPITAL_COMMUNITY): Payer: Medicaid Other

## 2022-12-12 DIAGNOSIS — N3001 Acute cystitis with hematuria: Secondary | ICD-10-CM | POA: Diagnosis not present

## 2022-12-12 DIAGNOSIS — R112 Nausea with vomiting, unspecified: Secondary | ICD-10-CM | POA: Insufficient documentation

## 2022-12-12 DIAGNOSIS — R1013 Epigastric pain: Secondary | ICD-10-CM

## 2022-12-12 DIAGNOSIS — R1011 Right upper quadrant pain: Secondary | ICD-10-CM | POA: Insufficient documentation

## 2022-12-12 DIAGNOSIS — R109 Unspecified abdominal pain: Secondary | ICD-10-CM | POA: Diagnosis not present

## 2022-12-12 DIAGNOSIS — K760 Fatty (change of) liver, not elsewhere classified: Secondary | ICD-10-CM | POA: Diagnosis not present

## 2022-12-12 LAB — COMPREHENSIVE METABOLIC PANEL
ALT: 37 U/L (ref 0–44)
AST: 26 U/L (ref 15–41)
Albumin: 4.3 g/dL (ref 3.5–5.0)
Alkaline Phosphatase: 66 U/L (ref 38–126)
Anion gap: 10 (ref 5–15)
BUN: 11 mg/dL (ref 6–20)
CO2: 22 mmol/L (ref 22–32)
Calcium: 9 mg/dL (ref 8.9–10.3)
Chloride: 104 mmol/L (ref 98–111)
Creatinine, Ser: 0.73 mg/dL (ref 0.44–1.00)
GFR, Estimated: >60 mL/min (ref >60)
Glucose, Bld: 112 mg/dL — ABNORMAL HIGH (ref 70–99)
Potassium: 3.6 mmol/L (ref 3.5–5.1)
Sodium: 136 mmol/L (ref 135–145)
Total Bilirubin: 2.2 mg/dL — ABNORMAL HIGH (ref 0.3–1.2)
Total Protein: 7.7 g/dL (ref 6.5–8.1)

## 2022-12-12 LAB — URINALYSIS, ROUTINE W REFLEX MICROSCOPIC
Bilirubin Urine: NEGATIVE
Glucose, UA: NEGATIVE mg/dL
Ketones, ur: NEGATIVE mg/dL
Nitrite: NEGATIVE
Protein, ur: 100 mg/dL — AB
RBC / HPF: >50 RBC/hpf (ref 0–5)
Specific Gravity, Urine: 1.03 (ref 1.005–1.030)
pH: 5 (ref 5.0–8.0)

## 2022-12-12 LAB — CBC WITH DIFFERENTIAL/PLATELET
Abs Immature Granulocytes: 0.04 10*3/uL (ref 0.00–0.07)
Basophils Absolute: 0.1 10*3/uL (ref 0.0–0.1)
Basophils Relative: 1 %
Eosinophils Absolute: 0.2 10*3/uL (ref 0.0–0.5)
Eosinophils Relative: 2 %
HCT: 46.4 % — ABNORMAL HIGH (ref 36.0–46.0)
Hemoglobin: 16 g/dL — ABNORMAL HIGH (ref 12.0–15.0)
Immature Granulocytes: 1 %
Lymphocytes Relative: 22 %
Lymphs Abs: 1.9 10*3/uL (ref 0.7–4.0)
MCH: 29.4 pg (ref 26.0–34.0)
MCHC: 34.5 g/dL (ref 30.0–36.0)
MCV: 85.1 fL (ref 80.0–100.0)
Monocytes Absolute: 0.6 10*3/uL (ref 0.1–1.0)
Monocytes Relative: 7 %
Neutro Abs: 6 10*3/uL (ref 1.7–7.7)
Neutrophils Relative %: 67 %
Platelets: 258 10*3/uL (ref 150–400)
RBC: 5.45 MIL/uL — ABNORMAL HIGH (ref 3.87–5.11)
RDW: 13 % (ref 11.5–15.5)
WBC: 8.9 10*3/uL (ref 4.0–10.5)
nRBC: 0 % (ref 0.0–0.2)

## 2022-12-12 LAB — LIPASE, BLOOD: Lipase: 33 U/L (ref 11–51)

## 2022-12-12 LAB — POC URINE PREG, ED: Preg Test, Ur: NEGATIVE

## 2022-12-12 LAB — HCG, QUANTITATIVE, PREGNANCY: hCG, Beta Chain, Quant, S: <1 m[IU]/mL (ref <5)

## 2022-12-12 MED ORDER — ONDANSETRON HCL 4 MG/2ML IJ SOLN
4.0000 mg | Freq: Once | INTRAMUSCULAR | Status: AC
  Administered 2022-12-12: 4 mg via INTRAVENOUS
  Filled 2022-12-12: qty 2

## 2022-12-12 MED ORDER — IOHEXOL 300 MG/ML  SOLN
100.0000 mL | Freq: Once | INTRAMUSCULAR | Status: AC | PRN
  Administered 2022-12-12: 100 mL via INTRAVENOUS

## 2022-12-12 MED ORDER — HYDROMORPHONE HCL 1 MG/ML IJ SOLN
0.5000 mg | Freq: Once | INTRAMUSCULAR | Status: AC
  Administered 2022-12-12: 0.5 mg via INTRAVENOUS
  Filled 2022-12-12: qty 0.5

## 2022-12-12 MED ORDER — CIPROFLOXACIN HCL 500 MG PO TABS: 500.0000 mg | ORAL_TABLET | Freq: Two times a day (BID) | ORAL | 0 refills | Status: DC

## 2022-12-12 MED ORDER — FLUCONAZOLE 150 MG PO TABS: 150.0000 mg | ORAL_TABLET | Freq: Every day | ORAL | 0 refills | Status: AC

## 2022-12-12 MED ORDER — SODIUM CHLORIDE 0.9 % IV BOLUS
1000.0000 mL | Freq: Once | INTRAVENOUS | Status: AC
  Administered 2022-12-12: 1000 mL via INTRAVENOUS

## 2022-12-12 MED ORDER — ONDANSETRON HCL 4 MG PO TABS: 4.0000 mg | ORAL_TABLET | Freq: Four times a day (QID) | ORAL | 0 refills | Status: AC

## 2022-12-12 MED ORDER — HYDROMORPHONE HCL 1 MG/ML IJ SOLN
1.0000 mg | Freq: Once | INTRAMUSCULAR | Status: AC
  Administered 2022-12-12: 1 mg via INTRAVENOUS
  Filled 2022-12-12: qty 1

## 2022-12-12 NOTE — Discharge Instructions (Addendum)
Return for any problem.  ?

## 2022-12-12 NOTE — ED Provider Notes (Signed)
Seen after prior ED provider.  Patient's pain is resolved.  CT imaging is without acute abnormality.  Patient's urine is suggestive of possible early UTI.  Patient with report of mildly increased frequency of urination.  Patient is agreeable with plan for treatment of possible UTI.  Patient feels comfortable plan for discharge home.  Strict return precautions given and understood importance of close follow-up repeatedly stressed.   Wynetta Fines, MD 12/12/22 1515

## 2022-12-12 NOTE — ED Triage Notes (Signed)
Pt comes from home. Had abdominal pain X2 day. Took a pregnancy tst at home, says positive. No period in 2 mo. Had tubal ligation May 2018. Has had "sulfur burps" X2 days as well. Urine darker than usual per pt. Pt has been taking Ozempic 3-4 months.

## 2022-12-12 NOTE — ED Provider Notes (Addendum)
 [place] EMERGENCY DEPARTMENT AT [facility] Provider Note   CSN: [id number] Arrival date & time: 12/12/22  2130     History Chief Complaint  Patient presents with   Abdominal Pain    Allison Dalton is a 32 y.o. female. Patient presents to the ED with concerns of abdominal pain. She has had pain present for the last two days and reports some associated mild nausea as well as "sulfur burps". Feels that majority of pain is in the center of her stomach as well as right side. Denies obvious urinary discomfort, but states urine has been darker recently. Currently taking Ozempic but denies any recent dose increases. States that she had tubal ligation in May 2018 but had two positive pregnancy tests at home. Sexually active with no form of contraceptive use.    Abdominal Pain      Home Medications Prior to Admission medications   Medication Sig Start Date End Date Taking? Authorizing Provider  ciprofloxacin (CIPRO) 500 MG tablet Take 1 tablet (500 mg total) by mouth every 12 (twelve) hours. 12/12/22  Yes Wynetta Fines, MD  fluconazole (DIFLUCAN) 150 MG tablet Take 1 tablet (150 mg total) by mouth daily for 1 day. 12/12/22 12/13/22 Yes Wynetta Fines, MD  ondansetron (ZOFRAN) 4 MG tablet Take 1 tablet (4 mg total) by mouth every 6 (six) hours. 12/12/22  Yes Wynetta Fines, MD  albuterol (VENTOLIN HFA) 108 (90 Base) MCG/ACT inhaler Inhale 2 puffs into the lungs every 6 (six) hours as needed for wheezing or shortness of breath. 10/08/17 08/19/21  [provider]  citalopram (CELEXA) 10 MG tablet Take 1 tablet by mouth daily. 11/26/21 11/26/22  [provider]  dicyclomine (BENTYL) 20 MG tablet Take 1 tablet (20 mg total) by mouth 2 (two) times daily. 12/24/21   Achille Rich, PA-C  doxycycline (VIBRAMYCIN) 100 MG capsule Take 1 capsule (100 mg total) by mouth 2 (two) times daily. Patient not taking: Reported on 09/09/2020 11/03/19   Triplett, Tammy, PA-C   fexofenadine-pseudoephedrine (ALLEGRA-D) 60-120 MG 12 hr tablet Take 1 tablet by mouth 2 (two) times daily. Patient not taking: Reported on 09/09/2020 04/07/19   Joni Reining, PA-C  fluticasone (FLOVENT HFA) 110 MCG/ACT inhaler Inhale 1 puff into the lungs daily as needed (SOB/Wheezing). 10/08/17 08/19/21  [provider]  gabapentin (NEURONTIN) 300 MG capsule Take 300 mg by mouth daily as needed (nerve pain). 07/07/21   [provider]  HYDROcodone-acetaminophen (NORCO/VICODIN) 5-325 MG tablet Take 1 tablet by mouth every 6 (six) hours as needed. 08/19/21   Burgess Amor, PA-C  hydrocortisone (ANUSOL-HC) 25 MG suppository Place 1 suppository (25 mg total) rectally 2 (two) times daily. 08/19/21   Burgess Amor, PA-C  ibuprofen (ADVIL) 600 MG tablet Take 1 tablet (600 mg total) by mouth 3 (three) times daily. Patient not taking: Reported on 08/19/2021 01/21/21   Burgess Amor, PA-C  ipratropium-albuterol (DUONEB) 0.5-2.5 (3) MG/3ML SOLN Take 3 mLs by nebulization every 6 (six) hours as needed. 12/24/21   Achille Rich, PA-C  lidocaine (XYLOCAINE) 2 % solution Use as directed 5 mLs in the mouth or throat every 6 (six) hours as needed for mouth pain. 08/14/22   Leath-Warren, Sadie Haber, NP  lisdexamfetamine (VYVANSE) 20 MG capsule Take 20 mg by mouth daily.    [provider]  loperamide (IMODIUM) 2 MG capsule Take 1 capsule (2 mg total) by mouth 4 (four) times daily as needed for diarrhea or loose stools. Patient not taking:  Reported on 08/19/2021 11/20/20   Burgess Amor, PA-C  LORazepam (ATIVAN) 0.5 MG tablet Take by mouth. 11/26/21   [provider]  methylphenidate 27 MG PO TB24 Take 27 mg by mouth daily. 04/08/21   [provider]  methylPREDNISolone (MEDROL DOSEPAK) 4 MG TBPK tablet Take Tapered dose as directed Patient not taking: Reported on 11/03/2019 04/07/19   Joni Reining, PA-C  ondansetron (ZOFRAN ODT) 4 MG disintegrating tablet Take 1 tablet (4 mg total) by mouth  every 8 (eight) hours as needed for nausea or vomiting. Patient not taking: Reported on 08/19/2021 11/20/20   Burgess Amor, PA-C  ondansetron (ZOFRAN) 4 MG tablet Take 1 tablet (4 mg total) by mouth every 6 (six) hours. Patient not taking: Reported on 09/09/2020 02/01/19   Triplett, Tammy, PA-C  pantoprazole (PROTONIX) 40 MG tablet Take 40 mg by mouth daily.    [provider]  promethazine (PHENERGAN) 25 MG suppository Place 1 suppository (25 mg total) rectally every 6 (six) hours as needed for nausea or vomiting. Patient not taking: Reported on 08/19/2021 09/09/20   Henderly, Britni A, PA-C  promethazine-dextromethorphan (PROMETHAZINE-DM) 6.25-15 MG/5ML syrup Take 5 mLs by mouth 4 (four) times daily as needed for cough. 08/14/22   Leath-Warren, Sadie Haber, NP  triamcinolone ointment (KENALOG) 0.5 % Apply 1 application topically 2 (two) times daily. Patient not taking: Reported on 11/03/2019 07/19/19   Hall-Potvin, [place], PA-C      Allergies    Penicillins, Amoxicillin-pot clavulanate, Clindamycin/lincomycin, Cocoa, Pecan extract, Gentamicin, and Other    Review of Systems   Review of Systems  Gastrointestinal:  Positive for abdominal pain.  All other systems reviewed and are negative.   Physical Exam Updated Vital Signs BP 119/79   Pulse 95   Temp 98.4 F (36.9 C) (Oral)   Resp 16   Ht 5\' 3"  (1.6 m)   Wt 96.3 kg   LMP 10/03/2022 (Exact Date)   SpO2 99%   BMI 37.61 kg/m  Physical Exam Vitals and nursing note reviewed.  Constitutional:      General: She is not in acute distress.    Appearance: She is well-developed.  HENT:     Head: Normocephalic and atraumatic.  Eyes:     Conjunctiva/sclera: Conjunctivae normal.  Cardiovascular:     Rate and Rhythm: Normal rate and regular rhythm.     Heart sounds: No murmur heard. Pulmonary:     Effort: Pulmonary effort is normal. No respiratory distress.     Breath sounds: Normal breath sounds.  Abdominal:     Palpations: Abdomen is  soft.     Tenderness: There is abdominal tenderness in the right upper quadrant and right lower quadrant. There is right CVA tenderness. There is no left CVA tenderness.  Musculoskeletal:        General: No swelling.     Cervical back: Neck supple.  Skin:    General: Skin is warm and dry.     Capillary Refill: Capillary refill takes less than 2 seconds.  Neurological:     Mental Status: She is alert.  Psychiatric:        Mood and Affect: Mood normal.     ED Results / Procedures / Treatments   Labs (all labs ordered are listed, but only abnormal results are displayed) Labs Reviewed  COMPREHENSIVE METABOLIC PANEL - Abnormal; Notable for the following components:      Result Value   Glucose, Bld 112 (*)    Total Bilirubin 2.2 (*)  All other components within normal limits  CBC WITH DIFFERENTIAL/PLATELET - Abnormal; Notable for the following components:   RBC 5.45 (*)    Hemoglobin 16.0 (*)    HCT 46.4 (*)    All other components within normal limits  URINALYSIS, ROUTINE W REFLEX MICROSCOPIC - Abnormal; Notable for the following components:   Color, Urine AMBER (*)    APPearance CLOUDY (*)    Hgb urine dipstick MODERATE (*)    Protein, ur 100 (*)    Leukocytes,Ua SMALL (*)    Bacteria, UA RARE (*)    All other components within normal limits  LIPASE, BLOOD  HCG, QUANTITATIVE, PREGNANCY  POC URINE PREG, ED    EKG None  Radiology CT ABDOMEN PELVIS W CONTRAST  Result Date: 12/12/2022 CLINICAL DATA:  Abdominal pain for 2 days. Positive home pregnancy test. EXAM: CT ABDOMEN AND PELVIS WITH CONTRAST TECHNIQUE: Multidetector CT imaging of the abdomen and pelvis was performed using the standard protocol following bolus administration of intravenous contrast. RADIATION DOSE REDUCTION: This exam was performed according to the departmental dose-optimization program which includes automated exposure control, adjustment of the mA and/or kV according to patient size and/or use of  iterative reconstruction technique. CONTRAST:  OMNIPAQUE IOHEXOL 300 MG/ML  SOLN COMPARISON:  12/24/2021. FINDINGS: Lower chest: Clear lung bases. Hepatobiliary: Liver normal in size. Decreased liver attenuation consistent with fatty infiltration. No liver mass. Status post cholecystectomy. No bile duct dilation. Pancreas: Unremarkable. No pancreatic ductal dilatation or surrounding inflammatory changes. Spleen: Normal in size without focal abnormality. Adrenals/Urinary Tract: Normal adrenal glands. Kidneys normal in size, orientation and position with symmetric enhancement. 6 mm low-attenuation mass, right kidney midpole consistent with a cyst. No follow-up indicated. No other renal masses or lesions, no stones and no hydronephrosis. Normal ureters. Normal bladder. Stomach/Bowel: Stomach is within normal limits. Appendix appears normal. No evidence of bowel wall thickening, distention, or inflammatory changes. Vascular/Lymphatic: No significant vascular findings are present. No enlarged abdominal or pelvic lymph nodes. Reproductive: Uterus and bilateral adnexa are unremarkable. Other: No abdominal wall hernia or abnormality. No abdominopelvic ascites. Musculoskeletal: No acute or significant osseous findings. IMPRESSION: 1. No acute findings within the abdomen or pelvis. 2. Hepatic steatosis. Electronically Signed   By: Amie Portland M.D.   On: 12/12/2022 14:40    Procedures Procedures   Medications Ordered in ED Medications  sodium chloride 0.9 % bolus 1,000 mL (0 mLs Intravenous Stopped 12/12/22 1523)  HYDROmorphone (DILAUDID) injection 1 mg (1 mg Intravenous Given 12/12/22 1114)  ondansetron (ZOFRAN) injection 4 mg (4 mg Intravenous Given 12/12/22 1119)  HYDROmorphone (DILAUDID) injection 0.5 mg (0.5 mg Intravenous Given 12/12/22 1250)  iohexol (OMNIPAQUE) 300 MG/ML solution 100 mL (100 mLs Intravenous Contrast Given 12/12/22 1407)    ED Course/ Medical Decision Making/ A&P Clinical Course as of  12/13/22 1013  Sat Dec 12, 2022  1444 CT ABDOMEN PELVIS W CONTRAST [PM]    Clinical Course User Index [PM] Wynetta Fines, MD                           Medical Decision Making Amount and/or Complexity of Data Reviewed Labs: ordered. Radiology: ordered. Decision-making details documented in ED Course.  Risk Prescription drug management.   This patient presents to the ED for concern of abdominal pain.  Differential diagnosis includes UTI, bowel obstruction, nephrolithiasis, pyelonephritis, constipation   Lab Tests:  I Ordered, and personally interpreted labs.  The pertinent results include:  CBC unremarkable, CMP unremarkable, UA with signs of possible infection however there is blood present which increases the likelihood of a renal stone, lipase normal, beta-hCG negative   Imaging Studies ordered:  I ordered imaging studies including CT abdomen pelvis with contrast CT imaging currently unavailable and will have patient transferred to [facility] for further evaluation with CT imaging there. I agree with the radiologist interpretation   Medicines ordered and prescription drug management:  I ordered medication including fluids, Zofran, Dilaudid for dehydration, vomiting, pain Reevaluation of the patient after these medicines showed that the patient improved I have reviewed the patients home medicines and have made adjustments as needed   Problem List / ED Course:  Patient presents to the emergency department complaints of abdominal pain.  She reports that she has been having abdominal pain for the last 2 days and took a home pregnancy test at home which showed positive findings.  She denies any periods over the last 2 months.  However she did receive a tubal ligation in May 2018.  She is currently on Ozempic and has been experiencing "sulfur burps". On examination, patient is tender to the right upper and right lower quadrants is also experiencing right-sided flank  pain.  Given patient presentation, will likely require imaging for evaluation of symptoms.  Will treat symptomatically with IV pain medication, fluids, antiemetics while awaiting results of labs. Labs show largely reassuring findings with CBC that is unremarkable, CMP is unremarkable, lipase is normal, beta-hCG is negative, however urinalysis does show signs of potential infection or potential renal involvement as there is blood present in the urine.  Will have patient transferred via [facility] for CT imaging to [facility].  Dr. Rodena Medin is the accepting physician in the emergency department there.  I did see that patient likely be able to safely discharge home following CT scanning however this will depend on the findings of the scan.  Informed patient of this current plan for transfer patient is agreeable with treatment plan.  Will have patient reassess following imaging reports at [facility].  Final Clinical Impression(s) / ED Diagnoses Final diagnoses:  Epigastric pain  Acute cystitis with hematuria  Nausea and vomiting, unspecified vomiting type    Rx / DC Orders ED Discharge Orders          Ordered    fluconazole (DIFLUCAN) 150 MG tablet  Daily        12/12/22 1514    ondansetron (ZOFRAN) 4 MG tablet  Every 6 hours        12/12/22 1514    ciprofloxacin (CIPRO) 500 MG tablet  Every 12 hours        12/12/22 1514              Smitty Knudsen, PA-C 12/13/22 1011    Smitty Knudsen, PA-C 12/13/22 1013    Kommor, Wyn Forster, MD 12/13/22 1023

## 2022-12-17 DIAGNOSIS — F4312 Post-traumatic stress disorder, chronic: Secondary | ICD-10-CM | POA: Diagnosis not present

## 2022-12-28 ENCOUNTER — Emergency Department (HOSPITAL_COMMUNITY): Payer: Medicaid Other

## 2022-12-28 ENCOUNTER — Encounter (HOSPITAL_COMMUNITY): Payer: Self-pay

## 2022-12-28 ENCOUNTER — Emergency Department (HOSPITAL_COMMUNITY)
Admission: EM | Admit: 2022-12-28 | Discharge: 2022-12-28 | Disposition: A | Discharge status: Home / Self Care | Payer: Medicaid Other | Attending: Emergency Medicine | Admitting: Emergency Medicine

## 2022-12-28 DIAGNOSIS — J45909 Unspecified asthma, uncomplicated: Secondary | ICD-10-CM | POA: Insufficient documentation

## 2022-12-28 DIAGNOSIS — N200 Calculus of kidney: Secondary | ICD-10-CM | POA: Diagnosis not present

## 2022-12-28 DIAGNOSIS — M545 Low back pain, unspecified: Secondary | ICD-10-CM | POA: Diagnosis present

## 2022-12-28 DIAGNOSIS — N132 Hydronephrosis with renal and ureteral calculous obstruction: Secondary | ICD-10-CM | POA: Insufficient documentation

## 2022-12-28 LAB — CBC WITH DIFFERENTIAL/PLATELET
Abs Immature Granulocytes: 0.01 10*3/uL (ref 0.00–0.07)
Basophils Absolute: 0 10*3/uL (ref 0.0–0.1)
Basophils Relative: 1 %
Eosinophils Absolute: 0.1 10*3/uL (ref 0.0–0.5)
Eosinophils Relative: 2 %
HCT: 44.6 % (ref 36.0–46.0)
Hemoglobin: 15.1 g/dL — ABNORMAL HIGH (ref 12.0–15.0)
Immature Granulocytes: 0 %
Lymphocytes Relative: 34 %
Lymphs Abs: 1.6 10*3/uL (ref 0.7–4.0)
MCH: 29.1 pg (ref 26.0–34.0)
MCHC: 33.9 g/dL (ref 30.0–36.0)
MCV: 85.9 fL (ref 80.0–100.0)
Monocytes Absolute: 0.4 10*3/uL (ref 0.1–1.0)
Monocytes Relative: 8 %
Neutro Abs: 2.6 10*3/uL (ref 1.7–7.7)
Neutrophils Relative %: 55 %
Platelets: 278 10*3/uL (ref 150–400)
RBC: 5.19 MIL/uL — ABNORMAL HIGH (ref 3.87–5.11)
RDW: 12.6 % (ref 11.5–15.5)
WBC: 4.8 10*3/uL (ref 4.0–10.5)
nRBC: 0 % (ref 0.0–0.2)

## 2022-12-28 LAB — URINALYSIS, ROUTINE W REFLEX MICROSCOPIC
Bilirubin Urine: NEGATIVE
Glucose, UA: NEGATIVE mg/dL
Ketones, ur: NEGATIVE mg/dL
Leukocytes,Ua: NEGATIVE
Nitrite: NEGATIVE
Protein, ur: 100 mg/dL — AB
RBC / HPF: >50 RBC/hpf (ref 0–5)
Specific Gravity, Urine: 1.025 (ref 1.005–1.030)
pH: 5 (ref 5.0–8.0)

## 2022-12-28 LAB — COMPREHENSIVE METABOLIC PANEL
ALT: 50 U/L — ABNORMAL HIGH (ref 0–44)
AST: 33 U/L (ref 15–41)
Albumin: 4.4 g/dL (ref 3.5–5.0)
Alkaline Phosphatase: 62 U/L (ref 38–126)
Anion gap: 10 (ref 5–15)
BUN: 10 mg/dL (ref 6–20)
CO2: 21 mmol/L — ABNORMAL LOW (ref 22–32)
Calcium: 8.9 mg/dL (ref 8.9–10.3)
Chloride: 105 mmol/L (ref 98–111)
Creatinine, Ser: 0.94 mg/dL (ref 0.44–1.00)
GFR, Estimated: >60 mL/min (ref >60)
Glucose, Bld: 109 mg/dL — ABNORMAL HIGH (ref 70–99)
Potassium: 3.8 mmol/L (ref 3.5–5.1)
Sodium: 136 mmol/L (ref 135–145)
Total Bilirubin: 2.5 mg/dL — ABNORMAL HIGH (ref 0.3–1.2)
Total Protein: 7.4 g/dL (ref 6.5–8.1)

## 2022-12-28 LAB — LIPASE, BLOOD: Lipase: 32 U/L (ref 11–51)

## 2022-12-28 LAB — PREGNANCY, URINE: Preg Test, Ur: NEGATIVE

## 2022-12-28 MED ORDER — HYDROCODONE-ACETAMINOPHEN 5-325 MG PO TABS: 2.0000 | ORAL_TABLET | ORAL | 0 refills | Status: DC | PRN

## 2022-12-28 MED ORDER — ONDANSETRON HCL 4 MG/2ML IJ SOLN
4.0000 mg | Freq: Once | INTRAMUSCULAR | Status: AC
  Administered 2022-12-28: 4 mg via INTRAVENOUS
  Filled 2022-12-28: qty 2

## 2022-12-28 MED ORDER — MORPHINE SULFATE (PF) 4 MG/ML IV SOLN
4.0000 mg | Freq: Once | INTRAVENOUS | Status: AC
  Administered 2022-12-28: 4 mg via INTRAVENOUS
  Filled 2022-12-28: qty 1

## 2022-12-28 MED ORDER — SODIUM CHLORIDE 0.9 % IV BOLUS
1000.0000 mL | Freq: Once | INTRAVENOUS | Status: AC
  Administered 2022-12-28: 1000 mL via INTRAVENOUS

## 2022-12-28 MED ORDER — KETOROLAC TROMETHAMINE 30 MG/ML IJ SOLN
30.0000 mg | Freq: Once | INTRAMUSCULAR | Status: AC
  Administered 2022-12-28: 30 mg via INTRAVENOUS
  Filled 2022-12-28: qty 1

## 2022-12-28 MED ORDER — TAMSULOSIN HCL 0.4 MG PO CAPS: 0.4000 mg | ORAL_CAPSULE | Freq: Every day | ORAL | 0 refills | Status: DC

## 2022-12-28 NOTE — ED Triage Notes (Signed)
Patient c/o left lower back pain, urinary frequency, dark colored urine and nausea x [phone].

## 2022-12-28 NOTE — Discharge Instructions (Signed)
Evaluation today did reveal you have a kidney stone which is consistent with your symptoms.  Recommend he continue assertive hydration, take the pain medicine as needed along with your Flomax daily.  Also recommend you follow-up with urology.  I provided their contact information discharge summary.  If you have worsening pain, new fever, worsening blood in the urine, nausea vomiting diarrhea or any other concerning symptom please return emerged part for further evaluation.

## 2022-12-28 NOTE — ED Provider Notes (Signed)
[place] EMERGENCY DEPARTMENT AT [facility] Provider Note   CSN: [id number] Arrival date & time: 12/28/22  0846     History  Chief Complaint  Patient presents with   Back Pain   HPI Allison Dalton is a 32 y.o. female with chronic pain, depression, asthma, and GERD and history of diverticulitis also status post cholecystectomy and tubal ligation presenting for back pain.  Started acutely this morning around 7 AM.  Woke her from sleep.  Pain is located in the left lower to mid back and radiates to the groin.  Also endorses dysuria and urinary frequency.  States her urine is dark in color.  Also endorsing nausea but no vomiting diarrhea.  Denies abnormal vaginal discharge or bleeding.  States she was seen by a medical provider a week ago and was diagnosed with a UTI and started on antibiotic at that time.  States the pain is sharp and stabbing pain.  States she has had a kidney stone before and symptoms feel similar.   Back Pain      Home Medications Prior to Admission medications   Medication Sig Start Date End Date Taking? Authorizing Provider  amphetamine-dextroamphetamine (ADDERALL XR) 15 MG 24 hr capsule Take 15 mg by mouth every morning. 12/17/22 03/17/23 Yes [provider]  gabapentin (NEURONTIN) 300 MG capsule Take 300 mg by mouth at bedtime. 07/07/21  Yes [provider]  HYDROcodone-acetaminophen (NORCO/VICODIN) 5-325 MG tablet Take 2 tablets by mouth every 4 (four) hours as needed. 12/28/22  Yes Gareth Eagle, PA-C  ipratropium-albuterol (DUONEB) 0.5-2.5 (3) MG/3ML SOLN Take 3 mLs by nebulization every 6 (six) hours as needed. 12/24/21  Yes Achille Rich, PA-C  ondansetron (ZOFRAN) 4 MG tablet Take 1 tablet (4 mg total) by mouth every 6 (six) hours. 12/12/22  Yes Messick, Noralyn Pick, MD  OZEMPIC, 1 MG/DOSE, 4 MG/3ML SOPN Inject 1 mg into the skin once a week.   Yes [provider]  promethazine (PHENERGAN) 25 MG tablet Take 25 mg by mouth  every 6 (six) hours as needed for vomiting or nausea. 10/28/22  Yes [provider]  tamsulosin (FLOMAX) 0.4 MG CAPS capsule Take 1 capsule (0.4 mg total) by mouth daily after breakfast. 12/28/22  Yes Gareth Eagle, PA-C      Allergies    Penicillins, Amoxicillin-pot clavulanate, Clindamycin/lincomycin, Cocoa, Pecan extract, Gentamicin, and Other    Review of Systems   Review of Systems  Musculoskeletal:  Positive for back pain.    Physical Exam Updated Vital Signs BP 122/77   Pulse (!) 51   Temp 98.2 F (36.8 C) (Oral)   Resp 18   LMP 12/14/2022 (Exact Date)   SpO2 100%  Physical Exam Vitals and nursing note reviewed.  HENT:     Head: Normocephalic and atraumatic.     Mouth/Throat:     Mouth: Mucous membranes are moist.  Eyes:     General:        Right eye: No discharge.        Left eye: No discharge.     Conjunctiva/sclera: Conjunctivae normal.  Cardiovascular:     Rate and Rhythm: Normal rate and regular rhythm.     Pulses: Normal pulses.     Heart sounds: Normal heart sounds.  Pulmonary:     Effort: Pulmonary effort is normal.     Breath sounds: Normal breath sounds.  Abdominal:     General: Abdomen is flat.     Palpations: Abdomen is soft.  Tenderness: There is abdominal tenderness in the left lower quadrant. There is left CVA tenderness.  Skin:    General: Skin is warm and dry.  Neurological:     General: No focal deficit present.  Psychiatric:        Mood and Affect: Mood normal.     ED Results / Procedures / Treatments   Labs (all labs ordered are listed, but only abnormal results are displayed) Labs Reviewed  CBC WITH DIFFERENTIAL/PLATELET - Abnormal; Notable for the following components:      Result Value   RBC 5.19 (*)    Hemoglobin 15.1 (*)    All other components within normal limits  COMPREHENSIVE METABOLIC PANEL - Abnormal; Notable for the following components:   CO2 21 (*)    Glucose, Bld 109 (*)    ALT 50 (*)    Total  Bilirubin 2.5 (*)    All other components within normal limits  URINALYSIS, ROUTINE W REFLEX MICROSCOPIC - Abnormal; Notable for the following components:   APPearance TURBID (*)    Hgb urine dipstick LARGE (*)    Protein, ur 100 (*)    Bacteria, UA RARE (*)    All other components within normal limits  LIPASE, BLOOD  PREGNANCY, URINE    EKG None  Radiology CT RENAL STONE STUDY  Result Date: 12/28/2022 CLINICAL DATA:  Abdominal/flank pain, stone suspected EXAM: CT ABDOMEN AND PELVIS WITHOUT CONTRAST TECHNIQUE: Multidetector CT imaging of the abdomen and pelvis was performed following the standard protocol without IV contrast. RADIATION DOSE REDUCTION: This exam was performed according to the departmental dose-optimization program which includes automated exposure control, adjustment of the mA and/or kV according to patient size and/or use of iterative reconstruction technique. COMPARISON:  CT AP 12/12/2022 and 12/24/2021. FINDINGS: Lower chest: No acute abnormality. Hepatobiliary: Diffuse hypodensity of the liver. No focal abnormality. Cholecystectomy. No biliary dilatation. Pancreas: No pancreatic ductal dilatation or surrounding inflammatory changes. Spleen: Normal in size without focal abnormality. Adrenals/Urinary Tract: Adrenal glands are unremarkable. 4 mm partially-obstructing calculus at the LEFT ureterovesicular junction. Mild LEFT hydronephrosis, with trace perirenal stranding. The RIGHT kidney is normal, without renal calculi, focal lesion, or hydronephrosis. Bladder is nondistended. Stomach/Bowel: Stomach is within normal limits. Appendix appears normal. No evidence of bowel wall thickening, distention, or inflammatory changes. Vascular/Lymphatic: No significant vascular findings are present. No enlarged abdominal or pelvic lymph nodes. Reproductive: Uterus and adnexa are unremarkable. Other: No abdominal wall hernia or abnormality. No abdominopelvic ascites. Musculoskeletal: No acute  or significant osseous findings. IMPRESSION: 4 mm partially-obstructing LEFT UVJ nephrolith, with mild resulting hydronephrosis. Electronically Signed   By: Roanna Banning M.D.   On: 12/28/2022 11:50    Procedures Procedures    Medications Ordered in ED Medications  ketorolac (TORADOL) 30 MG/ML injection 30 mg (has no administration in time range)  sodium chloride 0.9 % bolus 1,000 mL (0 mLs Intravenous Stopped 12/28/22 1059)  ondansetron (ZOFRAN) injection 4 mg (4 mg Intravenous Given 12/28/22 0937)  morphine (PF) 4 MG/ML injection 4 mg (4 mg Intravenous Given 12/28/22 6045)    ED Course/ Medical Decision Making/ A&P                             Medical Decision Making Amount and/or Complexity of Data Reviewed Labs: ordered. Radiology: ordered.  Risk Prescription drug management.   32 year old well-appearing female presenting for back pain.  Exam notable for left-sided CVA tenderness and left lower quadrant  tenderness.  DDx includes nephrolithiasis, pyelonephritis, diverticulitis, CAD, traumatic back injury.  CT scan revealed obstructive stone in the left UVJ.  Urinalysis revealed hematuria and trace bacteria.  Labs did not indicate any acute derangement.  Symptoms are consistent with obstructive UVJ stone.  Workup does not suggest associated infection or renal failure. After treatment she stated that her pain was improved.  Sent Norco and Flomax to her pharmacy.  Also advised assertive hydration at home.  Discussed pertinent return precautions.  Advised to follow-up with urology.  Vitals remained stable throughout encounter.         Final Clinical Impression(s) / ED Diagnoses Final diagnoses:  Nephrolithiasis    Rx / DC Orders ED Discharge Orders          Ordered    tamsulosin (FLOMAX) 0.4 MG CAPS capsule  Daily after breakfast        12/28/22 1233    HYDROcodone-acetaminophen (NORCO/VICODIN) 5-325 MG tablet  Every 4 hours PRN        12/28/22 1233               Gareth Eagle, PA-C 12/28/22 1235    Gloris Manchester, MD 12/29/22 1826

## 2022-12-28 NOTE — ED Notes (Signed)
Patient transported to CT via stretcher at this time.  

## 2022-12-28 NOTE — ED Notes (Signed)
Robinson, PA-C at bedside 

## 2023-01-03 DIAGNOSIS — H538 Other visual disturbances: Secondary | ICD-10-CM | POA: Diagnosis not present

## 2023-01-03 DIAGNOSIS — Z87891 Personal history of nicotine dependence: Secondary | ICD-10-CM | POA: Diagnosis not present

## 2023-01-03 DIAGNOSIS — F32A Depression, unspecified: Secondary | ICD-10-CM | POA: Diagnosis not present

## 2023-01-03 DIAGNOSIS — Z880 Allergy status to penicillin: Secondary | ICD-10-CM | POA: Diagnosis not present

## 2023-01-03 DIAGNOSIS — F419 Anxiety disorder, unspecified: Secondary | ICD-10-CM | POA: Diagnosis not present

## 2023-01-03 DIAGNOSIS — G43909 Migraine, unspecified, not intractable, without status migrainosus: Secondary | ICD-10-CM | POA: Diagnosis not present

## 2023-01-03 DIAGNOSIS — G43919 Migraine, unspecified, intractable, without status migrainosus: Secondary | ICD-10-CM | POA: Diagnosis not present

## 2023-01-17 ENCOUNTER — Encounter: Payer: Self-pay | Admitting: Emergency Medicine

## 2023-01-17 ENCOUNTER — Ambulatory Visit
Admission: EM | Admit: 2023-01-17 | Discharge: 2023-01-17 | Disposition: A | Discharge status: Home / Self Care | Payer: Medicaid Other

## 2023-01-17 DIAGNOSIS — H18892 Other specified disorders of cornea, left eye: Secondary | ICD-10-CM

## 2023-01-17 MED ORDER — POLYMYXIN B-TRIMETHOPRIM 10000-0.1 UNIT/ML-% OP SOLN: 1.0000 [drp] | Freq: Four times a day (QID) | OPHTHALMIC | 0 refills | Status: AC
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
## 2023-01-17 NOTE — Discharge Instructions (Addendum)
Apply eyedrops as prescribed. May take over-the-counter Tylenol or ibuprofen as needed for pain, fever, or general discomfort. Cool compresses to the left eye to help with pain and swelling. As discussed, it is recommended that you follow-up with your ophthalmologist on 01/18/2023 for further evaluation. Avoid use of contacts while symptoms persist.  Also discard any eye make-up that you are wearing prior to symptoms starting. Follow-up as needed.

## 2023-01-18 DIAGNOSIS — E119 Type 2 diabetes mellitus without complications: Secondary | ICD-10-CM | POA: Diagnosis not present

## 2023-01-18 DIAGNOSIS — H18893 Other specified disorders of cornea, bilateral: Secondary | ICD-10-CM | POA: Diagnosis not present

## 2023-01-18 DIAGNOSIS — R7989 Other specified abnormal findings of blood chemistry: Secondary | ICD-10-CM | POA: Diagnosis not present

## 2023-01-18 DIAGNOSIS — N200 Calculus of kidney: Secondary | ICD-10-CM | POA: Diagnosis not present

## 2023-01-18 DIAGNOSIS — E538 Deficiency of other specified B group vitamins: Secondary | ICD-10-CM | POA: Diagnosis not present

## 2023-01-22 DIAGNOSIS — M546 Pain in thoracic spine: Secondary | ICD-10-CM | POA: Diagnosis not present

## 2023-02-02 ENCOUNTER — Emergency Department (HOSPITAL_COMMUNITY)
Admission: EM | Admit: 2023-02-02 | Discharge: 2023-02-02 | Disposition: A | Discharge status: Home / Self Care | Payer: Medicaid Other | Attending: Emergency Medicine | Admitting: Emergency Medicine

## 2023-02-02 ENCOUNTER — Other Ambulatory Visit: Payer: Self-pay

## 2023-02-02 ENCOUNTER — Encounter (HOSPITAL_COMMUNITY): Payer: Self-pay

## 2023-02-02 ENCOUNTER — Emergency Department (HOSPITAL_COMMUNITY): Payer: Medicaid Other

## 2023-02-02 DIAGNOSIS — M542 Cervicalgia: Secondary | ICD-10-CM | POA: Insufficient documentation

## 2023-02-02 DIAGNOSIS — R519 Headache, unspecified: Secondary | ICD-10-CM | POA: Diagnosis not present

## 2023-02-02 MED ORDER — IBUPROFEN 800 MG PO TABS
800.0000 mg | ORAL_TABLET | Freq: Once | ORAL | Status: AC
  Administered 2023-02-02: 800 mg via ORAL
  Filled 2023-02-02: qty 1

## 2023-02-02 MED ORDER — CYCLOBENZAPRINE HCL 10 MG PO TABS: 10.0000 mg | ORAL_TABLET | Freq: Two times a day (BID) | ORAL | 0 refills | Status: DC | PRN

## 2023-02-02 MED ORDER — IBUPROFEN 400 MG PO TABS: 600.0000 mg | ORAL_TABLET | Freq: Once | ORAL | Status: DC

## 2023-02-02 NOTE — Discharge Instructions (Addendum)
Evaluation today was overall reassuring.  Recommend you follow-up with your PCP.  You may continue to apply ice to areas of pain around her neck and head.  He can also use ibuprofen and Tylenol for symptomatic relief.
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
## 2023-02-08 DIAGNOSIS — M545 Low back pain, unspecified: Secondary | ICD-10-CM | POA: Diagnosis not present

## 2023-02-08 DIAGNOSIS — M797 Fibromyalgia: Secondary | ICD-10-CM | POA: Diagnosis not present

## 2023-02-20 DIAGNOSIS — R300 Dysuria: Secondary | ICD-10-CM | POA: Diagnosis not present

## 2023-02-20 DIAGNOSIS — E669 Obesity, unspecified: Secondary | ICD-10-CM | POA: Diagnosis not present

## 2023-02-20 DIAGNOSIS — Z6831 Body mass index (BMI) 31.0-31.9, adult: Secondary | ICD-10-CM | POA: Diagnosis not present

## 2023-02-20 DIAGNOSIS — R051 Acute cough: Secondary | ICD-10-CM | POA: Diagnosis not present

## 2023-03-08 DIAGNOSIS — G43909 Migraine, unspecified, not intractable, without status migrainosus: Secondary | ICD-10-CM | POA: Diagnosis not present

## 2023-03-08 DIAGNOSIS — F32A Depression, unspecified: Secondary | ICD-10-CM | POA: Diagnosis not present

## 2023-03-08 DIAGNOSIS — F419 Anxiety disorder, unspecified: Secondary | ICD-10-CM | POA: Diagnosis not present

## 2023-03-08 DIAGNOSIS — Z91041 Radiographic dye allergy status: Secondary | ICD-10-CM | POA: Diagnosis not present

## 2023-03-08 DIAGNOSIS — Z880 Allergy status to penicillin: Secondary | ICD-10-CM | POA: Diagnosis not present

## 2023-03-08 DIAGNOSIS — G43009 Migraine without aura, not intractable, without status migrainosus: Secondary | ICD-10-CM | POA: Diagnosis not present

## 2023-03-11 DIAGNOSIS — R9431 Abnormal electrocardiogram [ECG] [EKG]: Secondary | ICD-10-CM | POA: Diagnosis not present

## 2023-03-11 DIAGNOSIS — G43009 Migraine without aura, not intractable, without status migrainosus: Secondary | ICD-10-CM | POA: Diagnosis not present

## 2023-03-11 DIAGNOSIS — F430 Acute stress reaction: Secondary | ICD-10-CM | POA: Diagnosis not present

## 2023-03-11 DIAGNOSIS — H539 Unspecified visual disturbance: Secondary | ICD-10-CM | POA: Diagnosis not present

## 2023-03-11 DIAGNOSIS — Z87891 Personal history of nicotine dependence: Secondary | ICD-10-CM | POA: Diagnosis not present

## 2023-03-11 DIAGNOSIS — Z820 Family history of epilepsy and other diseases of the nervous system: Secondary | ICD-10-CM | POA: Diagnosis not present

## 2023-03-11 DIAGNOSIS — R569 Unspecified convulsions: Secondary | ICD-10-CM | POA: Diagnosis not present

## 2023-03-11 DIAGNOSIS — I517 Cardiomegaly: Secondary | ICD-10-CM | POA: Diagnosis not present

## 2023-03-13 ENCOUNTER — Encounter (HOSPITAL_COMMUNITY): Payer: Self-pay

## 2023-03-13 ENCOUNTER — Other Ambulatory Visit: Payer: Self-pay

## 2023-03-13 ENCOUNTER — Emergency Department (HOSPITAL_COMMUNITY): Payer: Medicaid Other

## 2023-03-13 ENCOUNTER — Emergency Department (HOSPITAL_COMMUNITY)
Admission: EM | Admit: 2023-03-13 | Discharge: 2023-03-13 | Disposition: A | Discharge status: Home / Self Care | Payer: Medicaid Other | Attending: Emergency Medicine | Admitting: Emergency Medicine

## 2023-03-13 DIAGNOSIS — R079 Chest pain, unspecified: Secondary | ICD-10-CM | POA: Diagnosis not present

## 2023-03-13 DIAGNOSIS — R5381 Other malaise: Secondary | ICD-10-CM | POA: Diagnosis not present

## 2023-03-13 DIAGNOSIS — J45909 Unspecified asthma, uncomplicated: Secondary | ICD-10-CM | POA: Diagnosis not present

## 2023-03-13 DIAGNOSIS — R0602 Shortness of breath: Secondary | ICD-10-CM | POA: Diagnosis not present

## 2023-03-13 DIAGNOSIS — Z794 Long term (current) use of insulin: Secondary | ICD-10-CM | POA: Insufficient documentation

## 2023-03-13 DIAGNOSIS — R0789 Other chest pain: Secondary | ICD-10-CM | POA: Diagnosis not present

## 2023-03-13 HISTORY — DX: Unspecified convulsions: R56.9

## 2023-03-13 LAB — BASIC METABOLIC PANEL
Anion gap: 8 (ref 5–15)
BUN: 11 mg/dL (ref 6–20)
CO2: 23 mmol/L (ref 22–32)
Calcium: 9 mg/dL (ref 8.9–10.3)
Chloride: 105 mmol/L (ref 98–111)
Creatinine, Ser: 0.9 mg/dL (ref 0.44–1.00)
GFR, Estimated: >60 mL/min (ref >60)
Glucose, Bld: 100 mg/dL — ABNORMAL HIGH (ref 70–99)
Potassium: 4 mmol/L (ref 3.5–5.1)
Sodium: 136 mmol/L (ref 135–145)

## 2023-03-13 LAB — CBC
HCT: 43.6 % (ref 36.0–46.0)
Hemoglobin: 14.4 g/dL (ref 12.0–15.0)
MCH: 28.2 pg (ref 26.0–34.0)
MCHC: 33 g/dL (ref 30.0–36.0)
MCV: 85.5 fL (ref 80.0–100.0)
Platelets: 228 10*3/uL (ref 150–400)
RBC: 5.1 MIL/uL (ref 3.87–5.11)
RDW: 12.8 % (ref 11.5–15.5)
WBC: 6.8 10*3/uL (ref 4.0–10.5)
nRBC: 0 % (ref 0.0–0.2)

## 2023-03-13 LAB — HCG, SERUM, QUALITATIVE: Preg, Serum: NEGATIVE

## 2023-03-13 LAB — D-DIMER, QUANTITATIVE: D-Dimer, Quant: 0.46 ug{FEU}/mL (ref 0.00–0.50)

## 2023-03-13 LAB — CBG MONITORING, ED: Glucose-Capillary: 98 mg/dL (ref 70–99)

## 2023-03-13 LAB — TROPONIN I (HIGH SENSITIVITY)
Troponin I (High Sensitivity): <2 ng/L (ref <18)
Troponin I (High Sensitivity): 2 ng/L (ref <18)

## 2023-03-13 MED ORDER — ALBUTEROL SULFATE HFA 108 (90 BASE) MCG/ACT IN AERS
1.0000 | INHALATION_SPRAY | Freq: Four times a day (QID) | RESPIRATORY_TRACT | 0 refills | Status: DC | PRN
Start: 2023-03-13 — End: 2024-06-06

## 2023-03-13 MED ORDER — NAPROXEN 500 MG PO TABS
500.0000 mg | ORAL_TABLET | Freq: Two times a day (BID) | ORAL | 0 refills | Status: AC
Start: 2023-03-13 — End: ?

## 2023-03-13 MED ORDER — LORAZEPAM 1 MG PO TABS
0.5000 mg | ORAL_TABLET | Freq: Once | ORAL | Status: AC
  Administered 2023-03-13: 0.5 mg via ORAL
  Filled 2023-03-13: qty 1

## 2023-03-13 MED ORDER — ACETAMINOPHEN 500 MG PO TABS
1000.0000 mg | ORAL_TABLET | Freq: Once | ORAL | Status: AC
  Administered 2023-03-13: 1000 mg via ORAL
  Filled 2023-03-13: qty 2

## 2023-03-13 NOTE — ED Provider Notes (Signed)
[place] EMERGENCY DEPARTMENT AT [facility] Provider Note   CSN: [id number] Arrival date & time: 03/13/23  1191     History  Chief Complaint  Patient presents with   Shortness of Breath   Chest Pain    Allison Dalton is a 32 y.o. female history of anxiety, asthma, diverticulitis, GERD, migraine, seizure presents today for evaluation of chest pain.  Patient states that her symptoms have been intermittent for weeks but has gotten worse in the last couple days.  Pain is located on the left side of the chest that radiates to left arm, nonexertional, nonpleuritic.  Denies neck pain, jaw pain, back pain.  States "I had a heart attack 2 years ago but the doctor did not want to put a stent because of my insurance".  Denies any fever, cough, recent long travel, history of PE/DVT, leg swelling.   Shortness of Breath Associated symptoms: chest pain   Chest Pain Associated symptoms: shortness of breath     Past Medical History:  Diagnosis Date   Anxiety    Asthma    Chronic pain    Depression    Diverticulitis    GERD (gastroesophageal reflux disease)    Hernia, hiatal    Migraine headache    Mood disorder (HCC)    Seizures (HCC)    Vitamin B12 deficiency    Past Surgical History:  Procedure Laterality Date   CHOLECYSTECTOMY     TUBAL LIGATION     UPPER GASTROINTESTINAL ENDOSCOPY       Home Medications Prior to Admission medications   Medication Sig Start Date End Date Taking? Authorizing Provider  albuterol (VENTOLIN HFA) 108 (90 Base) MCG/ACT inhaler Inhale 1-2 puffs into the lungs every 6 (six) hours as needed for wheezing or shortness of breath. 03/13/23  Yes Jeanelle Malling, PA  naproxen (NAPROSYN) 500 MG tablet Take 1 tablet (500 mg total) by mouth 2 (two) times daily. 03/13/23  Yes Jeanelle Malling, PA  amphetamine-dextroamphetamine (ADDERALL XR) 15 MG 24 hr capsule Take 15 mg by mouth every morning. 12/17/22 03/17/23  [provider]  cyclobenzaprine (FLEXERIL) 10 MG  tablet Take 1 tablet (10 mg total) by mouth 2 (two) times daily as needed for muscle spasms. 02/02/23   Gareth Eagle, PA-C  gabapentin (NEURONTIN) 300 MG capsule Take 300 mg by mouth at bedtime. 07/07/21   [provider]  HYDROcodone-acetaminophen (NORCO/VICODIN) 5-325 MG tablet Take 2 tablets by mouth every 4 (four) hours as needed. 12/28/22   Gareth Eagle, PA-C  ipratropium-albuterol (DUONEB) 0.5-2.5 (3) MG/3ML SOLN Take 3 mLs by nebulization every 6 (six) hours as needed. 12/24/21   Achille Rich, PA-C  ondansetron (ZOFRAN) 4 MG tablet Take 1 tablet (4 mg total) by mouth every 6 (six) hours. 12/12/22   Wynetta Fines, MD  OZEMPIC, 1 MG/DOSE, 4 MG/3ML SOPN Inject 1 mg into the skin once a week.    [provider]  pantoprazole (PROTONIX) 20 MG tablet Take 20 mg by mouth daily.    [provider]  promethazine (PHENERGAN) 25 MG tablet Take 25 mg by mouth every 6 (six) hours as needed for vomiting or nausea. 10/28/22   [provider]  tamsulosin (FLOMAX) 0.4 MG CAPS capsule Take 1 capsule (0.4 mg total) by mouth daily after breakfast. 12/28/22   Gareth Eagle, PA-C      Allergies    Penicillins, Amoxicillin-pot clavulanate, Benadryl [diphenhydramine], Clindamycin/lincomycin, Cocoa, Pecan extract, Gentamicin, and Other    Review  of Systems   Review of Systems  Respiratory:  Positive for shortness of breath.   Cardiovascular:  Positive for chest pain.    Physical Exam Updated Vital Signs BP 104/69   Pulse 76   Temp 98.6 F (37 C) (Oral)   Resp 17   Ht 5\' 3"  (1.6 m)   Wt 87.5 kg   SpO2 100%   BMI 34.17 kg/m  Physical Exam Vitals and nursing note reviewed.  Constitutional:      Appearance: Normal appearance.  HENT:     Head: Normocephalic and atraumatic.     Mouth/Throat:     Mouth: Mucous membranes are moist.  Eyes:     General: No scleral icterus. Cardiovascular:     Rate and Rhythm: Normal rate and regular rhythm.     Pulses:  Normal pulses.     Heart sounds: Normal heart sounds.  Pulmonary:     Effort: Pulmonary effort is normal.     Breath sounds: Normal breath sounds.  Abdominal:     General: Abdomen is flat.     Palpations: Abdomen is soft.     Tenderness: There is no abdominal tenderness.  Musculoskeletal:        General: No deformity.  Skin:    General: Skin is warm.     Findings: No rash.  Neurological:     General: No focal deficit present.     Mental Status: She is alert.  Psychiatric:        Mood and Affect: Mood normal.     Comments: Anxious.     ED Results / Procedures / Treatments   Labs (all labs ordered are listed, but only abnormal results are displayed) Labs Reviewed  BASIC METABOLIC PANEL - Abnormal; Notable for the following components:      Result Value   Glucose, Bld 100 (*)    All other components within normal limits  CBC  HCG, SERUM, QUALITATIVE  D-DIMER, QUANTITATIVE  CBG MONITORING, ED  TROPONIN I (HIGH SENSITIVITY)  TROPONIN I (HIGH SENSITIVITY)    EKG EKG Interpretation Date/Time:  Saturday March 13 2023 09:48:42 EDT Ventricular Rate:  80 PR Interval:  146 QRS Duration:  72 QT Interval:  360 QTC Calculation: 415 R Axis:   43  Text Interpretation: Normal sinus rhythm with sinus arrhythmia Cannot rule out Anterior infarct , age undetermined Abnormal ECG When compared with ECG of 24-Dec-2021 10:53, PREVIOUS ECG IS PRESENT when compared to prior, similar appearance. No STEMI Confirmed by Theda Belfast ([id number]) on 03/13/2023 12:40:57 PM  Radiology DG Chest 2 View  Result Date: 03/13/2023 CLINICAL DATA:  Left-sided chest pain with shortness of breath for 2 days. EXAM: CHEST - 2 VIEW COMPARISON:  Radiographs 12/24/2021 and 08/19/2021. FINDINGS: The heart size and mediastinal contours are normal. The lungs are clear. There is no pleural effusion or pneumothorax. No acute osseous findings are identified. IMPRESSION: Stable chest.  No active cardiopulmonary process.  Electronically Signed   By: Carey Bullocks M.D.   On: 03/13/2023 10:57    Procedures Procedures    Medications Ordered in ED Medications  LORazepam (ATIVAN) tablet 0.5 mg (has no administration in time range)  acetaminophen (TYLENOL) tablet 1,000 mg (1,000 mg Oral Given 03/13/23 1236)    ED Course/ Medical Decision Making/ A&P                                 Medical Decision Making Amount  and/or Complexity of Data Reviewed Labs: ordered. Radiology: ordered.  Risk OTC drugs. Prescription drug management.   This patient presents to the ED for chest pain, this involves an extensive number of treatment options, and is a complaint that carries with a high risk of complications and morbidity.  The differential diagnosis includes ACS, pericarditis, PE, pneumothorax, pneumonia, less likely dissection with essentially normal blood pressure, symmetric bilateral pulses, and no back pain. This is not an exhaustive list.  Lab tests: I ordered and personally interpreted labs.  The pertinent results include: WBC unremarkable. Hbg unremarkable. Platelets unremarkable. Electrolytes unremarkable. BUN, creatinine unremarkable.  Negative D-dimer, troponin, pregnancy test.  Imaging studies: I ordered imaging studies, personally reviewed, interpreted imaging and agree with the radiologist's interpretations. The results include: Negative chest x-ray.  Problem list/ ED course/ Critical interventions/ Medical management: HPI: See above Vital signs within normal range and stable throughout visit. Laboratory/imaging studies significant for: See above. On physical examination, patient is afebrile and appears in no acute distress.  Exam without evidence of volume overload so doubt heart failure.  EKG without signs of active ischemia, delta troponin was negative so doubt NSTEMI.  Presentation not consistent with PE or DVT, D-dimer was negative, Wells low risk.  Chest x-ray did not show any evidence of  pneumonia or pneumothorax.  Unlikely myocarditis, no recent illness, negative troponin.  Patient does appear anxious on my examination.  Given a dose of Ativan 0.5 mg. Heart score 1 so plan to discharge patient home with cardiology follow-up. I have reviewed the patient home medicines and have made adjustments as needed.  Cardiac monitoring/EKG: The patient was maintained on a cardiac monitor.  I personally reviewed and interpreted the cardiac monitor which showed an underlying rhythm of: sinus rhythm.  Additional history obtained: External records from outside source obtained and reviewed including: Chart review including previous notes, labs, imaging.  Consultations obtained:  Disposition Continued outpatient therapy. Follow-up with cardiology recommended for reevaluation of symptoms. Treatment plan discussed with patient.  Pt acknowledged understanding was agreeable to the plan. Worrisome signs and symptoms were discussed with patient, and patient acknowledged understanding to return to the ED if they noticed these signs and symptoms. Patient was stable upon discharge.   This chart was dictated using voice recognition software.  Despite best efforts to proofread,  errors can occur which can change the documentation meaning.          Final Clinical Impression(s) / ED Diagnoses Final diagnoses:  Chest pain, unspecified type    Rx / DC Orders ED Discharge Orders          Ordered    Ambulatory referral to Cardiology       Comments: If you have not heard from the Cardiology office within the next 72 hours please call [phone].   03/13/23 1441    albuterol (VENTOLIN HFA) 108 (90 Base) MCG/ACT inhaler  Every 6 hours PRN        03/13/23 1443    naproxen (NAPROSYN) 500 MG tablet  2 times daily        03/13/23 1443              Jeanelle Malling, [place] 03/13/23 1916    Tegeler, Canary Brim, MD 03/14/23 [phone]

## 2023-03-13 NOTE — Discharge Instructions (Addendum)
Please take your medications as prescribed. Take tylenol/ibuprofen for pain. I recommend close follow-up with cardiology for reevaluation.  Please do not hesitate to return to emergency department if worrisome signs symptoms we discussed become apparent.

## 2023-03-13 NOTE — ED Notes (Signed)
 Pt verbalized understanding of discharge instructions. Pt ambulated from the ed with steady gait.

## 2023-03-13 NOTE — ED Triage Notes (Signed)
Pt c/o left sided chest pain that radiates to left arm and left leg, SOB, fatigue, weaknessx1wk

## 2023-03-16 ENCOUNTER — Ambulatory Visit: Payer: Medicaid Other | Attending: Internal Medicine | Admitting: Internal Medicine

## 2023-03-16 ENCOUNTER — Encounter: Payer: Self-pay | Admitting: Internal Medicine

## 2023-03-16 VITALS — BP 100/68 | HR 86 | Ht 63.0 in | Wt 171.0 lb

## 2023-03-16 DIAGNOSIS — R079 Chest pain, unspecified: Secondary | ICD-10-CM

## 2023-03-16 DIAGNOSIS — Z01818 Encounter for other preprocedural examination: Secondary | ICD-10-CM | POA: Diagnosis not present

## 2023-03-16 DIAGNOSIS — I340 Nonrheumatic mitral (valve) insufficiency: Secondary | ICD-10-CM | POA: Diagnosis not present

## 2023-03-16 DIAGNOSIS — O3500X0 Maternal care for (suspected) central nervous system malformation or damage in fetus, unspecified, not applicable or unspecified: Secondary | ICD-10-CM | POA: Diagnosis not present

## 2023-03-16 MED ORDER — PREDNISONE 50 MG PO TABS: ORAL_TABLET | ORAL | 0 refills | Status: DC

## 2023-03-16 MED ORDER — METOPROLOL TARTRATE 100 MG PO TABS: ORAL_TABLET | ORAL | 0 refills | Status: DC

## 2023-03-16 MED ORDER — DIPHENHYDRAMINE HCL 50 MG PO TABS: ORAL_TABLET | ORAL | 0 refills | Status: AC

## 2023-03-16 NOTE — Patient Instructions (Addendum)
Medication Instructions:  None *If you need a refill on your cardiac medications before your next appointment, please call your pharmacy*   Lab Work: BMET  If you have labs (blood work) drawn today and your tests are completely normal, you will receive your results only by: MyChart Message (if you have MyChart) OR A paper copy in the mail If you have any lab test that is abnormal or we need to change your treatment, we will call you to review the results.   Testing/Procedures: Cardiac CTA  Your physician has requested that you have an echocardiogram. Echocardiography is a painless test that uses sound waves to create images of your heart. It provides your doctor with information about the size and shape of your heart and how well your heart's chambers and valves are working. This procedure takes approximately one hour. There are no restrictions for this procedure. Please do NOT wear cologne, perfume, aftershave, or lotions (deodorant is allowed). Please arrive 15 minutes prior to your appointment time.    Follow-Up: At [facility], you and your health needs are our priority.  As part of our continuing mission to provide you with exceptional heart care, we have created designated Provider Care Teams.  These Care Teams include your primary Cardiologist (physician) and Advanced Practice Providers (APPs -  Physician Assistants and Nurse Practitioners) who all work together to provide you with the care you need, when you need it.  We recommend signing up for the patient portal called "MyChart".  Sign up information is provided on this After Visit Summary.  MyChart is used to connect with patients for Virtual Visits (Telemedicine).  Patients are able to view lab/test results, encounter notes, upcoming appointments, etc.  Non-urgent messages can be sent to your provider as well.   To learn more about what you can do with MyChart, go to [email or web address].    Your next  appointment:    Pending Testing Results  Provider:   You may see Vishnu P Mallipeddi, MD or one of the following Advanced Practice Providers on your designated Care Team:   Randall An, PA-C  Jacolyn Reedy, PA-C     Other Instructions    Your cardiac CT will be scheduled at one of the below locations:   [facility] [address] [place], [place] [address] [phone]  If scheduled at [facility], please arrive at the [facility] Entrance (Entrance C2) of [facility] 30 minutes prior to test start time. You can use the FREE valet parking offered at entrance C (encouraged to control the heart rate for the test)  Proceed to the [facility] Radiology Department (first floor) to check-in and test prep.  All radiology patients and guests should use entrance C2 at [facility], accessed from [facility], even though the hospital's physical address listed is [address].    Please follow these instructions carefully (unless otherwise directed):  An IV will be required for this test and Nitroglycerin will be given.   On the Night Before the Test: Be sure to Drink plenty of water. Do not consume any caffeinated/decaffeinated beverages or chocolate 12 hours prior to your test. Do not take any antihistamines 12 hours prior to your test. If the patient has contrast allergy: Patient will need a prescription for Prednisone and very clear instructions (as follows): Prednisone 50 mg - take 13 hours prior to test Take another Prednisone 50 mg 7 hours prior to test Take  another Prednisone 50 mg 1 hour prior to test Take Benadryl 50 mg 1 hour prior to test Patient must complete all four doses of above prophylactic medications. Patient will need a ride after test due to Benadryl.  On the Day of the Test: Drink plenty of water until 1 hour prior to the test. Do not eat any food 1 hour prior to test. You may take  your regular medications prior to the test.  Take metoprolol (Lopressor) two hours prior to test. If you take Furosemide/Hydrochlorothiazide/Spironolactone, please HOLD on the morning of the test. FEMALES- please wear underwire-free bra if available, avoid dresses & tight clothing  After the Test: Drink plenty of water. After receiving IV contrast, you may experience a mild flushed feeling. This is normal. On occasion, you may experience a mild rash up to 24 hours after the test. This is not dangerous. If this occurs, you can take Benadryl 25 mg and increase your fluid intake. If you experience trouble breathing, this can be serious. If it is severe call 911 IMMEDIATELY. If it is mild, please call our office. If you take any of these medications: Glipizide/Metformin, Avandament, Glucavance, please do not take 48 hours after completing test unless otherwise instructed.  We will call to schedule your test 2-4 weeks out understanding that some insurance companies will need an authorization prior to the service being performed.   For more information and frequently asked questions, please visit our website : [email or web address]  For non-scheduling related questions, please contact the cardiac imaging nurse navigator should you have any questions/concerns: Cardiac Imaging Nurse Navigators Direct Office Dial: [phone]   For scheduling needs, including cancellations and rescheduling, please call [place], [phone].

## 2023-03-16 NOTE — Progress Notes (Signed)
Cardiology Office Note  Date: 03/16/2023   ID: Allison Dalton, DOB 02/19/1991, MRN [id number]  PCP:  [facility]  Cardiologist:  Marjo Bicker, MD Electrophysiologist:  None   History of Present Illness: Allison Dalton is a 32 y.o. female known to have anxiety, depression, seizures was referred to cardiology clinic for evaluation of chest pain.  Patient was seen by [facility] cardiology in 2022, underwent stress EKG and stress echo which were within normal limits except for mild MR.  She continues to have almost daily chest pains radiating to the left arm and is extremely worried that she wants to live until her kids grow up.  She has a family history of CAD and is worried if she has CAD.  No DOE, orthopnea, PND, dizziness, presyncope and syncope.  EKG shows nonspecific ST-T changes, troponins within normal limits.    Past Medical History:  Diagnosis Date   Anxiety    Asthma    Chronic pain    Depression    Diverticulitis    GERD (gastroesophageal reflux disease)    Hernia, hiatal    Migraine headache    Mood disorder (HCC)    Seizures (HCC)    Vitamin B12 deficiency     Past Surgical History:  Procedure Laterality Date   CHOLECYSTECTOMY     TUBAL LIGATION     UPPER GASTROINTESTINAL ENDOSCOPY      Current Outpatient Medications  Medication Sig Dispense Refill   albuterol (VENTOLIN HFA) 108 (90 Base) MCG/ACT inhaler Inhale 1-2 puffs into the lungs every 6 (six) hours as needed for wheezing or shortness of breath. 8 g 0   gabapentin (NEURONTIN) 300 MG capsule Take 300 mg by mouth at bedtime.     ondansetron (ZOFRAN) 4 MG tablet Take 1 tablet (4 mg total) by mouth every 6 (six) hours. 12 tablet 0   OZEMPIC, 1 MG/DOSE, 4 MG/3ML SOPN Inject 1 mg into the skin once a week.     pantoprazole (PROTONIX) 20 MG tablet Take 20 mg by mouth daily.     promethazine (PHENERGAN) 25 MG tablet Take 25 mg by mouth every 6 (six) hours as needed for vomiting or  nausea.     amphetamine-dextroamphetamine (ADDERALL XR) 15 MG 24 hr capsule Take 15 mg by mouth every morning. (Patient not taking: Reported on 03/16/2023)     Butalbital-APAP-Caffeine 50-300-40 MG CAPS TAKE 1 CAPSULE BY MOUTH EVERY 4 HOURS AS NEEDED FOR HEADACHE (Patient not taking: Reported on 03/16/2023)     cyclobenzaprine (FLEXERIL) 10 MG tablet Take 1 tablet (10 mg total) by mouth 2 (two) times daily as needed for muscle spasms. (Patient not taking: Reported on 03/16/2023) 20 tablet 0   HYDROcodone-acetaminophen (NORCO/VICODIN) 5-325 MG tablet Take 2 tablets by mouth every 4 (four) hours as needed. (Patient not taking: Reported on 03/16/2023) 8 tablet 0   ipratropium-albuterol (DUONEB) 0.5-2.5 (3) MG/3ML SOLN Take 3 mLs by nebulization every 6 (six) hours as needed. (Patient not taking: Reported on 03/16/2023) 360 mL 0   naproxen (NAPROSYN) 500 MG tablet Take 1 tablet (500 mg total) by mouth 2 (two) times daily. (Patient not taking: Reported on 03/16/2023) 30 tablet 0   tamsulosin (FLOMAX) 0.4 MG CAPS capsule Take 1 capsule (0.4 mg total) by mouth daily after breakfast. (Patient not taking: Reported on 03/16/2023) 30 capsule 0   No current facility-administered medications for this visit.   Allergies:  Penicillins, Amoxicillin-pot clavulanate, Benadryl [diphenhydramine], Clindamycin/lincomycin, Cocoa, Pecan extract, Gentamicin, and Other  Social History: The patient  reports that she has quit smoking. Her smoking use included cigarettes. She has never used smokeless tobacco. She reports that she does not drink alcohol and does not use drugs.   Family History: The patient's family history includes Asthma in her mother; Depression in her father and mother.   ROS:  Please see the history of present illness. Otherwise, complete review of systems is positive for none.  All other systems are reviewed and negative.   Physical Exam: VS:  BP 100/68   Pulse 86   Ht 5\' 3"  (1.6 m)   Wt 171 lb (77.6 kg)   SpO2  98%   BMI 30.29 kg/m , BMI Body mass index is 30.29 kg/m.  Wt Readings from Last 3 Encounters:  03/16/23 171 lb (77.6 kg)  03/13/23 192 lb 14.4 oz (87.5 kg)  02/02/23 193 lb (87.5 kg)    General: Patient appears comfortable at rest. HEENT: Conjunctiva and lids normal, oropharynx clear with moist mucosa. Neck: Supple, no elevated JVP or carotid bruits, no thyromegaly. Lungs: Clear to auscultation, nonlabored breathing at rest. Cardiac: Regular rate and rhythm, no S3 or significant systolic murmur, no pericardial rub. Abdomen: Soft, nontender, no hepatomegaly, bowel sounds present, no guarding or rebound. Extremities: No pitting edema, distal pulses 2+. Skin: Warm and dry. Musculoskeletal: No kyphosis. Neuropsychiatric: Alert and oriented x3, affect grossly appropriate.  Recent Labwork: 12/28/2022: ALT 50; AST 33 03/13/2023: BUN 11; Creatinine, Ser 0.90; Hemoglobin 14.4; Platelets 228; Potassium 4.0; Sodium 136  No results found for: "CHOL", "TRIG", "HDL", "CHOLHDL", "VLDL", "LDLCALC", "LDLDIRECT"   Assessment and Plan:   Chest pain: ETT and stress echo in 2022 were within normal limits at [facility].  She continues to have chest pains radiating to the left arm, almost daily, has family history of CAD.  Obtain CTA cardiac and 2D echocardiogram. Discussed yoga meditation and stress management techniques.  Patient has allergic reaction to contrast, needs prednisone Benadryl prior to CT cardiac.  She also needs urine pregnancy test on the day of the test.  Mild MR in 2022: Obtain 2D echocardiogram (obtained mainly for chest pains).   Medication Adjustments/Labs and Tests Ordered: Current medicines are reviewed at length with the patient today.  Concerns regarding medicines are outlined above.    Disposition:  Follow up pending results  Signed, Yanina Knupp Verne Spurr, MD, 03/16/2023 2:32 PM    [facility] at [facility] [address], [place], [place] [address]

## 2023-03-18 ENCOUNTER — Other Ambulatory Visit: Payer: Self-pay

## 2023-03-18 ENCOUNTER — Emergency Department
Admission: EM | Admit: 2023-03-18 | Discharge: 2023-03-18 | Disposition: A | Discharge status: Home / Self Care | Payer: Medicaid Other | Attending: Emergency Medicine | Admitting: Emergency Medicine

## 2023-03-18 ENCOUNTER — Encounter: Payer: Self-pay | Admitting: Emergency Medicine

## 2023-03-18 ENCOUNTER — Emergency Department: Payer: Medicaid Other

## 2023-03-18 DIAGNOSIS — R569 Unspecified convulsions: Secondary | ICD-10-CM | POA: Insufficient documentation

## 2023-03-18 DIAGNOSIS — R0789 Other chest pain: Secondary | ICD-10-CM | POA: Insufficient documentation

## 2023-03-18 DIAGNOSIS — R079 Chest pain, unspecified: Secondary | ICD-10-CM

## 2023-03-18 LAB — CBC
HCT: 44.9 % (ref 36.0–46.0)
Hemoglobin: 14.9 g/dL (ref 12.0–15.0)
MCH: 28.5 pg (ref 26.0–34.0)
MCHC: 33.2 g/dL (ref 30.0–36.0)
MCV: 85.9 fL (ref 80.0–100.0)
Platelets: 229 10*3/uL (ref 150–400)
RBC: 5.23 MIL/uL — ABNORMAL HIGH (ref 3.87–5.11)
RDW: 12.7 % (ref 11.5–15.5)
WBC: 5.7 10*3/uL (ref 4.0–10.5)
nRBC: 0 % (ref 0.0–0.2)

## 2023-03-18 LAB — BASIC METABOLIC PANEL
Anion gap: 7 (ref 5–15)
BUN: 14 mg/dL (ref 6–20)
CO2: 23 mmol/L (ref 22–32)
Calcium: 9 mg/dL (ref 8.9–10.3)
Chloride: 105 mmol/L (ref 98–111)
Creatinine, Ser: 0.7 mg/dL (ref 0.44–1.00)
GFR, Estimated: >60 mL/min (ref >60)
Glucose, Bld: 99 mg/dL (ref 70–99)
Potassium: 3.8 mmol/L (ref 3.5–5.1)
Sodium: 135 mmol/L (ref 135–145)

## 2023-03-18 LAB — URINALYSIS, ROUTINE W REFLEX MICROSCOPIC
Bilirubin Urine: NEGATIVE
Glucose, UA: NEGATIVE mg/dL
Hgb urine dipstick: NEGATIVE
Ketones, ur: 5 mg/dL — AB
Leukocytes,Ua: NEGATIVE
Nitrite: NEGATIVE
Protein, ur: NEGATIVE mg/dL
Specific Gravity, Urine: 1.029 (ref 1.005–1.030)
pH: 5 (ref 5.0–8.0)

## 2023-03-18 LAB — POC URINE PREG, ED: Preg Test, Ur: NEGATIVE

## 2023-03-18 LAB — TROPONIN I (HIGH SENSITIVITY): Troponin I (High Sensitivity): <2 ng/L (ref <18)

## 2023-03-18 MED ORDER — GABAPENTIN 100 MG PO CAPS: ORAL_CAPSULE | ORAL | 2 refills | Status: AC

## 2023-03-18 NOTE — ED Triage Notes (Signed)
Says thinks she has a seizrue today.  Having seizures and being seen for that--next month neurology.  Says more frequent.  And somethng wrong with heart??

## 2023-03-18 NOTE — ED Provider Notes (Signed)
[facility] Provider Note    Event Date/Time   First MD Initiated Contact with Patient 03/18/23 1107     (approximate)  History   Chief Complaint: Seizure activity, chest pain  HPI  Allison Dalton is a 32 y.o. female with a past medical history of anxiety, depression, gastric reflux, migraines, mood disorder, presents to the emergency department with multiple complaints.  Patient states for years now she has been experiencing intermittent chest pain.  She was told at one point that she likely had a heart attack in the past.  Patient states she continues to have intermittent chest pain.  States she went to [facility] cardiology and requested a catheterization but was denied essentially.  I reviewed the patient's chart from [facility] and patient had a reported normal stress test.  Patient has a follow-up appointment with cardiology with [facility] coming up and the patient will be getting a CT scan of her heart.  As a another complaint patient states she has been getting migraines, but states often times they respond to Excedrin.  As a third complaint patient states she believes she is having seizures and cannot get in with neurology for an EEG until next month.  Patient describes her seizure activity this morning as feeling agitated followed by a headache and then a sensation that she might pass out.  Symptoms do not sound overly concerning for seizure activity possible absence seizure but does not sound concerning for tonic-clonic seizure.  I discussed with the patient no driving or bathing alone until she has been cleared by neurology.  Patient states she is prescribed gabapentin to be taken 3 times daily for this condition, however she states it makes her too tired during the day so she only takes 1 capsule at night.  Physical Exam   Triage Vital Signs: ED Triage Vitals  Encounter Vitals Group     BP 03/18/23 0953 127/85     Systolic BP Percentile --      Diastolic BP Percentile --       Pulse Rate 03/18/23 0953 74     Resp 03/18/23 0953 18     Temp 03/18/23 0953 98.1 F (36.7 C)     Temp Source 03/18/23 0953 Oral     SpO2 03/18/23 0953 97 %     Weight 03/18/23 1001 170 lb 15.8 oz (77.6 kg)     Height 03/18/23 1001 5\' 3"  (1.6 m)     Head Circumference --      Peak Flow --      Pain Score 03/18/23 1000 6     Pain Loc --      Pain Education --      Exclude from Growth Chart --     Most recent vital signs: Vitals:   03/18/23 0953  BP: 127/85  Pulse: 74  Resp: 18  Temp: 98.1 F (36.7 C)  SpO2: 97%    General: Awake, no distress.  CV:  Good peripheral perfusion.  Regular rate and rhythm  Resp:  Normal effort.  Equal breath sounds bilaterally.  Abd:  No distention.    ED Results / Procedures / Treatments   EKG  EKG viewed and interpreted by myself shows normal sinus rhythm at 67 bpm the narrow QRS, normal axis, normal intervals, no concerning ST findings.  No ST elevation.  RADIOLOGY  I have reviewed and interpreted chest x-ray images.  No consolidation on my evaluation.   MEDICATIONS ORDERED IN ED: Medications - No data  to display   IMPRESSION / MDM / ASSESSMENT AND PLAN / ED COURSE  I reviewed the triage vital signs and the nursing notes.  Patient's presentation is most consistent with acute presentation with potential threat to life or bodily function.  Patient's workup has resulted showing negative pregnancy test reassuring urinalysis, reassuring chemistry reassuring CBC and a negative troponin.  As far as the patient's complaint of chest pain intermittently over the last few years given her reassuring EKG and workup today including negative troponin I do not believe there is an acute cardiac concern and the patient already has a CTA of her heart scheduled next week.  I believe the patient is safe for discharge home from the standpoint.  Given the patient's report of seizure-like activity, her description does not seem concerning for  tonic-clonic seizure, I did discuss with the patient that this could possibly indicative of more of a panic disorder or anxiety.  Cannot rule out absence seizure based on her description of these events.  Patient states she has an EEG scheduled but it is in approximately 1 month and she was hoping to have 1 performed sooner.  Discussed with the patient that we could not perform 1 sooner in the emergency department, but did ensure that the patient is not to be driving it is not to be bathing alone until she has the EEG.  Patient states she was prescribed gabapentin 3 times daily but states it makes her too tired so she is only taking 300 mg once at night.  I discussed with the patient a trial of 100 mg in the morning, afternoon and then still doing the 300 mg at night.  Patient is agreeable to try this medication regimen.  Given the patient's overall reassuring workup reassuring physical exam and vitals I believe the patient is safe for discharge home with outpatient follow-up.  FINAL CLINICAL IMPRESSION(S) / ED DIAGNOSES   Chest pain Concern for seizure-like activity   Note:  This document was prepared using Dragon voice recognition software and may include unintentional dictation errors.   Minna Antis, MD 03/18/23 [phone]

## 2023-03-18 NOTE — Discharge Instructions (Signed)
As we discussed please continue to take your gabapentin 300 mg each evening, you have been prescribed 100 mg gabapentin capsules.  Please take 100 mg capsule in the morning, early afternoon and continue to take it 300 mg capsule in the evening.  Please follow-up with neurology and cardiology as you have scheduled.  Return to the emergency department for any symptom concerning to yourself.

## 2023-03-19 ENCOUNTER — Other Ambulatory Visit: Payer: Self-pay

## 2023-03-19 ENCOUNTER — Telehealth: Payer: Self-pay | Admitting: Internal Medicine

## 2023-03-19 ENCOUNTER — Encounter (HOSPITAL_COMMUNITY): Payer: Self-pay

## 2023-03-19 MED ORDER — IVABRADINE HCL 7.5 MG PO TABS: ORAL_TABLET | ORAL | 0 refills | Status: AC

## 2023-03-19 NOTE — Telephone Encounter (Signed)
Pt is requesting a callback regarding the medication she needs to take before her CT on 9/10 being out of stock at Pharmacy. She is very frustrated about it. Please advise

## 2023-03-19 NOTE — Telephone Encounter (Signed)
Pt c/o medication issue:  1. Name of Medication:   2. How are you currently taking this medication (dosage and times per day)?  vabradine (CORLANOR) 7.5 MG TABS tablet   3. Are you having a reaction (difficulty breathing--STAT)?   4. What is your medication issue?   Allison Dalton with [facility] states they received the prescription for Corlanor, but they will not have any in stock until Tuesday. She mentions that [employer], [employer] and [facility] [employer] also do not have the medication. They would like to know if something else needs to be prescribed before Tuesday.

## 2023-03-22 ENCOUNTER — Other Ambulatory Visit (HOSPITAL_COMMUNITY)
Admission: RE | Admit: 2023-03-22 | Discharge: 2023-03-22 | Disposition: A | Discharge status: Home / Self Care | Payer: Medicaid Other | Source: Ambulatory Visit | Attending: Internal Medicine | Admitting: Internal Medicine

## 2023-03-22 DIAGNOSIS — R079 Chest pain, unspecified: Secondary | ICD-10-CM | POA: Diagnosis not present

## 2023-03-22 DIAGNOSIS — Z01818 Encounter for other preprocedural examination: Secondary | ICD-10-CM | POA: Insufficient documentation

## 2023-03-22 LAB — BASIC METABOLIC PANEL
Anion gap: 7 (ref 5–15)
BUN: 13 mg/dL (ref 6–20)
CO2: 23 mmol/L (ref 22–32)
Calcium: 8.5 mg/dL — ABNORMAL LOW (ref 8.9–10.3)
Chloride: 104 mmol/L (ref 98–111)
Creatinine, Ser: 0.68 mg/dL (ref 0.44–1.00)
GFR, Estimated: >60 mL/min (ref >60)
Glucose, Bld: 86 mg/dL (ref 70–99)
Potassium: 3.5 mmol/L (ref 3.5–5.1)
Sodium: 134 mmol/L — ABNORMAL LOW (ref 135–145)

## 2023-03-22 LAB — PREGNANCY, URINE: Preg Test, Ur: NEGATIVE

## 2023-03-22 NOTE — Group Note (Deleted)

## 2023-03-22 NOTE — Telephone Encounter (Signed)
Secure chat message sent to provider   Secure Chat Message from Dr.Mallipeddi:  Reschedule CT cardiac  and re-order prednisone with benadryl 1 day prior to CT cardiac  VM Only after Ivabradine is available      I spoke with [facility] who transferred her rx to [facility] states they filled her prednisone and she picked it up. Marchelle Folks at [facility] says they filled Corlanor 3-5 mg tablets for patient.I spoke with patient,she is en route to pick up the Corlanor.

## 2023-03-22 NOTE — Telephone Encounter (Signed)
Pt was able to get medication at [facility]

## 2023-03-23 ENCOUNTER — Ambulatory Visit (HOSPITAL_COMMUNITY)
Admission: RE | Admit: 2023-03-23 | Discharge: 2023-03-23 | Disposition: A | Discharge status: Home / Self Care | Payer: Medicaid Other | Source: Ambulatory Visit | Attending: Internal Medicine | Admitting: Internal Medicine

## 2023-03-23 ENCOUNTER — Ambulatory Visit: Payer: Self-pay | Admitting: Podiatry

## 2023-03-23 DIAGNOSIS — R079 Chest pain, unspecified: Secondary | ICD-10-CM | POA: Diagnosis present

## 2023-03-23 DIAGNOSIS — R072 Precordial pain: Secondary | ICD-10-CM

## 2023-03-23 MED ORDER — DILTIAZEM HCL 25 MG/5ML IV SOLN
INTRAVENOUS | Status: AC
  Filled 2023-03-23: qty 5

## 2023-03-23 MED ORDER — IOHEXOL 350 MG/ML SOLN
95.0000 mL | Freq: Once | INTRAVENOUS | Status: AC | PRN
  Administered 2023-03-23: 95 mL via INTRAVENOUS

## 2023-03-23 MED ORDER — DILTIAZEM HCL 25 MG/5ML IV SOLN
5.0000 mg | INTRAVENOUS | Status: DC | PRN
  Administered 2023-03-23: 5 mg via INTRAVENOUS

## 2023-03-23 MED ORDER — NITROGLYCERIN 0.4 MG SL SUBL
SUBLINGUAL_TABLET | SUBLINGUAL | Status: AC
  Filled 2023-03-23: qty 2

## 2023-03-23 MED ORDER — NITROGLYCERIN 0.4 MG SL SUBL
0.8000 mg | SUBLINGUAL_TABLET | Freq: Once | SUBLINGUAL | Status: AC
  Administered 2023-03-23: 0.8 mg via SUBLINGUAL

## 2023-03-23 MED ORDER — METOPROLOL TARTRATE 5 MG/5ML IV SOLN
10.0000 mg | INTRAVENOUS | Status: DC | PRN
  Administered 2023-03-23: 10 mg via INTRAVENOUS

## 2023-03-23 MED ORDER — METOPROLOL TARTRATE 5 MG/5ML IV SOLN
INTRAVENOUS | Status: AC
  Filled 2023-03-23: qty 20

## 2023-03-25 ENCOUNTER — Telehealth: Payer: Self-pay

## 2023-03-25 DIAGNOSIS — E119 Type 2 diabetes mellitus without complications: Secondary | ICD-10-CM | POA: Diagnosis not present

## 2023-03-25 DIAGNOSIS — G43009 Migraine without aura, not intractable, without status migrainosus: Secondary | ICD-10-CM | POA: Diagnosis not present

## 2023-03-25 DIAGNOSIS — E538 Deficiency of other specified B group vitamins: Secondary | ICD-10-CM | POA: Diagnosis not present

## 2023-03-25 NOTE — Telephone Encounter (Signed)
Received a scheduling message that patient made a appointment on mychart for 04/27/2023 with our PA tina Gerre Pebbles. Reviewed patient chart and she is a establish patient with [facility] and has a follow up appointment with them on 05/06/2023. Called patient and patient states that she was trying to get in sooner because she is having rectal bleeding. Inform her she needed to call [facility] to let them know her problems. She states she has and they told her to go to the ER they can not see her. Informed her since she is a establish patient with them we are not accepting transfers at this time

## 2023-03-27 ENCOUNTER — Other Ambulatory Visit: Payer: Self-pay

## 2023-03-27 ENCOUNTER — Emergency Department (HOSPITAL_COMMUNITY): Payer: Medicaid Other

## 2023-03-27 ENCOUNTER — Emergency Department (HOSPITAL_COMMUNITY)
Admission: EM | Admit: 2023-03-27 | Discharge: 2023-03-27 | Disposition: A | Discharge status: Home / Self Care | Payer: Medicaid Other | Attending: Emergency Medicine | Admitting: Emergency Medicine

## 2023-03-27 ENCOUNTER — Encounter (HOSPITAL_COMMUNITY): Payer: Self-pay

## 2023-03-27 DIAGNOSIS — R0602 Shortness of breath: Secondary | ICD-10-CM | POA: Diagnosis not present

## 2023-03-27 DIAGNOSIS — N200 Calculus of kidney: Secondary | ICD-10-CM | POA: Diagnosis not present

## 2023-03-27 DIAGNOSIS — R079 Chest pain, unspecified: Secondary | ICD-10-CM | POA: Insufficient documentation

## 2023-03-27 DIAGNOSIS — J45909 Unspecified asthma, uncomplicated: Secondary | ICD-10-CM | POA: Insufficient documentation

## 2023-03-27 DIAGNOSIS — R5383 Other fatigue: Secondary | ICD-10-CM | POA: Insufficient documentation

## 2023-03-27 DIAGNOSIS — Z9049 Acquired absence of other specified parts of digestive tract: Secondary | ICD-10-CM | POA: Diagnosis not present

## 2023-03-27 DIAGNOSIS — R1011 Right upper quadrant pain: Secondary | ICD-10-CM | POA: Diagnosis not present

## 2023-03-27 DIAGNOSIS — R109 Unspecified abdominal pain: Secondary | ICD-10-CM | POA: Insufficient documentation

## 2023-03-27 DIAGNOSIS — R110 Nausea: Secondary | ICD-10-CM | POA: Insufficient documentation

## 2023-03-27 LAB — CBC
HCT: 43.2 % (ref 36.0–46.0)
Hemoglobin: 14.2 g/dL (ref 12.0–15.0)
MCH: 28.3 pg (ref 26.0–34.0)
MCHC: 32.9 g/dL (ref 30.0–36.0)
MCV: 86.1 fL (ref 80.0–100.0)
Platelets: 270 10*3/uL (ref 150–400)
RBC: 5.02 MIL/uL (ref 3.87–5.11)
RDW: 12.7 % (ref 11.5–15.5)
WBC: 6.6 10*3/uL (ref 4.0–10.5)
nRBC: 0 % (ref 0.0–0.2)

## 2023-03-27 LAB — BASIC METABOLIC PANEL
Anion gap: 8 (ref 5–15)
BUN: 12 mg/dL (ref 6–20)
CO2: 25 mmol/L (ref 22–32)
Calcium: 9.2 mg/dL (ref 8.9–10.3)
Chloride: 104 mmol/L (ref 98–111)
Creatinine, Ser: 0.76 mg/dL (ref 0.44–1.00)
GFR, Estimated: >60 mL/min (ref >60)
Glucose, Bld: 91 mg/dL (ref 70–99)
Potassium: 3.7 mmol/L (ref 3.5–5.1)
Sodium: 137 mmol/L (ref 135–145)

## 2023-03-27 LAB — WET PREP, GENITAL
Clue Cells Wet Prep HPF POC: NONE SEEN
Sperm: NONE SEEN
Trich, Wet Prep: NONE SEEN
WBC, Wet Prep HPF POC: <10 (ref <10)
Yeast Wet Prep HPF POC: NONE SEEN

## 2023-03-27 LAB — URINALYSIS, ROUTINE W REFLEX MICROSCOPIC
Glucose, UA: NEGATIVE mg/dL
Hgb urine dipstick: NEGATIVE
Ketones, ur: 15 mg/dL — AB
Leukocytes,Ua: NEGATIVE
Nitrite: NEGATIVE
Protein, ur: NEGATIVE mg/dL
Specific Gravity, Urine: >1.03 — ABNORMAL HIGH (ref 1.005–1.030)
pH: 5.5 (ref 5.0–8.0)

## 2023-03-27 LAB — TROPONIN I (HIGH SENSITIVITY)
Troponin I (High Sensitivity): <2 ng/L (ref <18)
Troponin I (High Sensitivity): <2 ng/L (ref <18)

## 2023-03-27 LAB — RAPID HIV SCREEN (HIV 1/2 AB+AG)
HIV 1/2 Antibodies: NONREACTIVE
HIV-1 P24 Antigen - HIV24: NONREACTIVE

## 2023-03-27 LAB — CBG MONITORING, ED: Glucose-Capillary: 86 mg/dL (ref 70–99)

## 2023-03-27 LAB — LIPASE, BLOOD: Lipase: 30 U/L (ref 11–51)

## 2023-03-27 LAB — HCG, SERUM, QUALITATIVE: Preg, Serum: NEGATIVE

## 2023-03-27 MED ORDER — IBUPROFEN 400 MG PO TABS
600.0000 mg | ORAL_TABLET | Freq: Once | ORAL | Status: AC
  Administered 2023-03-27: 600 mg via ORAL
  Filled 2023-03-27: qty 1

## 2023-03-27 NOTE — Discharge Instructions (Signed)
Testing done today is reassuring.  Take ibuprofen and Tylenol as needed for pain.  Stay hydrated.  Follow-up in MyChart or with your PCP on the slow turnaround test results from today.  Return to the emergency department for any new or worsening symptoms of concern.

## 2023-03-27 NOTE — ED Notes (Signed)
Patient transported to CT 

## 2023-03-27 NOTE — ED Triage Notes (Signed)
Pt arrives via POV. Pt reports over the past month, her BG has been fluctuating. Pt reports having intermittent chest pain, recurrent seizures, sob, fatigue, abdominal pain, and nausea. Pt AxOx4.

## 2023-03-27 NOTE — ED Provider Notes (Signed)
[employer] EMERGENCY DEPARTMENT AT [facility] Provider Note   CSN: [id number] Arrival date & time: 03/27/23  1031     History  Chief Complaint  Patient presents with   Chest Pain   Abdominal Pain   Nausea    Melise Wiedemann is a 32 y.o. female.   Chest Pain Associated symptoms: abdominal pain, fatigue and nausea   Abdominal Pain Associated symptoms: chest pain, fatigue and nausea   Patient presents for multiple complaints.  Medical history includes anxiety, depression, long-term opiate use, chronic pain, fibromyalgia, GERD, seizures, asthma.  Patient reports intermittent chest pain, abdominal pain, fatigue, shortness of breath, nausea.  She had a similar presentation to the emergency department 9 days ago.  4 days ago, she underwent a coronary CT which showed 0% stenoses with calcium score of 0.  She was seen by PCP 2 days ago.  She feels that her blood sugars have been fluctuating due to recent symptoms.  Recent symptoms are described as intermittent chest pain, abdominal pain, nausea, and fatigue.  She is trying to get set up with continuous blood glucose monitor by her PCP.  She has been unable to do this.  She does get every 3 month A1c checks.  Most recently, this was 5.4.  She is currently on Ozempic.  Patient states that she feels that she has type 1 diabetes because this runs in her family.  She currently endorses right flank pain and is concerned about a kidney stone.  She states that she had a kidney stone 3 months ago.  She also requests STI testing.     Home Medications Prior to Admission medications   Medication Sig Start Date End Date Taking? Authorizing Provider  albuterol (VENTOLIN HFA) 108 (90 Base) MCG/ACT inhaler Inhale 1-2 puffs into the lungs every 6 (six) hours as needed for wheezing or shortness of breath. 03/13/23   Jeanelle Malling, PA  Butalbital-APAP-Caffeine 50-300-40 MG CAPS TAKE 1 CAPSULE BY MOUTH EVERY 4 HOURS AS NEEDED FOR HEADACHE Patient not  taking: Reported on 03/16/2023    [provider]  cyclobenzaprine (FLEXERIL) 10 MG tablet Take 1 tablet (10 mg total) by mouth 2 (two) times daily as needed for muscle spasms. Patient not taking: Reported on 03/16/2023 02/02/23   Gareth Eagle, PA-C  diphenhydrAMINE (BENADRYL) 50 MG tablet Take 1 tablet 1 hour prior to CT scan 03/16/23   Mallipeddi, Vishnu P, MD  gabapentin (NEURONTIN) 100 MG capsule Take 1 tablet in the morning, 1 tablet early afternoon 03/18/23   Minna Antis, MD  gabapentin (NEURONTIN) 300 MG capsule Take 300 mg by mouth at bedtime. 07/07/21   [provider]  HYDROcodone-acetaminophen (NORCO/VICODIN) 5-325 MG tablet Take 2 tablets by mouth every 4 (four) hours as needed. Patient not taking: Reported on 03/16/2023 12/28/22   Gareth Eagle, PA-C  ipratropium-albuterol (DUONEB) 0.5-2.5 (3) MG/3ML SOLN Take 3 mLs by nebulization every 6 (six) hours as needed. Patient not taking: Reported on 03/16/2023 12/24/21   Achille Rich, PA-C  ivabradine (CORLANOR) 7.5 MG TABS tablet Take 2 tablets 2 hours prior to CT scan 03/19/23   Mallipeddi, Vishnu P, MD  naproxen (NAPROSYN) 500 MG tablet Take 1 tablet (500 mg total) by mouth 2 (two) times daily. Patient not taking: Reported on 03/16/2023 03/13/23   Jeanelle Malling, PA  ondansetron (ZOFRAN) 4 MG tablet Take 1 tablet (4 mg total) by mouth every 6 (six) hours. 12/12/22   Wynetta Fines, MD  OZEMPIC, 1 MG/DOSE, 4  MG/3ML SOPN Inject 1 mg into the skin once a week.    [provider]  pantoprazole (PROTONIX) 20 MG tablet Take 20 mg by mouth daily.    [provider]  predniSONE (DELTASONE) 50 MG tablet Take 1 tablet 13 hours prior to CT, then take 1 table 7 prior to CT, then take 1 tablet 1 hour prior to CT 03/16/23   Mallipeddi, Vishnu P, MD  promethazine (PHENERGAN) 25 MG tablet Take 25 mg by mouth every 6 (six) hours as needed for vomiting or nausea. 10/28/22   [provider]  tamsulosin (FLOMAX) 0.4 MG CAPS  capsule Take 1 capsule (0.4 mg total) by mouth daily after breakfast. Patient not taking: Reported on 03/16/2023 12/28/22   Gareth Eagle, PA-C      Allergies    Penicillins, Amoxicillin-pot clavulanate, Benadryl [diphenhydramine], Clindamycin/lincomycin, Cocoa, Iodinated contrast media, Pecan extract, Gentamicin, and Other    Review of Systems   Review of Systems  Constitutional:  Positive for fatigue.  Cardiovascular:  Positive for chest pain.  Gastrointestinal:  Positive for abdominal pain and nausea.  Genitourinary:  Positive for flank pain.  Psychiatric/Behavioral:  The patient is nervous/anxious.   All other systems reviewed and are negative.   Physical Exam Updated Vital Signs BP 116/79 (BP Location: Right Arm)   Pulse 75   Temp 98.7 F (37.1 C) (Oral)   Resp 18   LMP 02/15/2023 (Approximate)   SpO2 98%  Physical Exam Vitals and nursing note reviewed.  Constitutional:      General: She is not in acute distress.    Appearance: She is well-developed. She is not ill-appearing, toxic-appearing or diaphoretic.  HENT:     Head: Normocephalic and atraumatic.  Eyes:     Extraocular Movements: Extraocular movements intact.     Conjunctiva/sclera: Conjunctivae normal.  Cardiovascular:     Rate and Rhythm: Normal rate and regular rhythm.  Pulmonary:     Effort: Pulmonary effort is normal. No tachypnea or respiratory distress.  Abdominal:     Palpations: Abdomen is soft.     Tenderness: There is abdominal tenderness.  Musculoskeletal:        General: No swelling. Normal range of motion.     Cervical back: Normal range of motion and neck supple.     Right lower leg: No edema.     Left lower leg: No edema.  Skin:    General: Skin is warm and dry.     Coloration: Skin is not cyanotic or pale.  Neurological:     General: No focal deficit present.     Mental Status: She is alert and oriented to person, place, and time.  Psychiatric:        Mood and Affect: Mood is  anxious.        Behavior: Behavior normal.     ED Results / Procedures / Treatments   Labs (all labs ordered are listed, but only abnormal results are displayed) Labs Reviewed  URINALYSIS, ROUTINE W REFLEX MICROSCOPIC - Abnormal; Notable for the following components:      Result Value   Specific Gravity, Urine >1.030 (*)    Bilirubin Urine MODERATE (*)    Ketones, ur 15 (*)    All other components within normal limits  WET PREP, GENITAL  BASIC METABOLIC PANEL  CBC  HCG, SERUM, QUALITATIVE  LIPASE, BLOOD  RAPID HIV SCREEN (HIV 1/2 AB+AG)  CBG MONITORING, ED  GC/CHLAMYDIA PROBE AMP ([place]) NOT AT [facility]  TROPONIN  I (HIGH SENSITIVITY)  TROPONIN I (HIGH SENSITIVITY)    EKG EKG Interpretation Date/Time:  Saturday March 27 2023 10:50:12 EDT Ventricular Rate:  84 PR Interval:  146 QRS Duration:  76 QT Interval:  348 QTC Calculation: 411 R Axis:   73  Text Interpretation: Normal sinus rhythm Confirmed by Gloris Manchester [phone]) on 03/27/2023 12:41:47 PM  Radiology CT Renal Stone Study  Result Date: 03/27/2023 CLINICAL DATA:  Abdominal/flank pain, stone suspected. EXAM: CT ABDOMEN AND PELVIS WITHOUT CONTRAST TECHNIQUE: Multidetector CT imaging of the abdomen and pelvis was performed following the standard protocol without IV contrast. RADIATION DOSE REDUCTION: This exam was performed according to the departmental dose-optimization program which includes automated exposure control, adjustment of the mA and/or kV according to patient size and/or use of iterative reconstruction technique. COMPARISON:  CT renal stone protocol 12/28/2022 FINDINGS: Lower chest: The lung bases are clear without focal nodule, mass, or airspace disease. The heart size is normal. No significant pleural or pericardial effusion is present. Hepatobiliary: No focal liver abnormality is seen. Status post cholecystectomy. No biliary dilatation. Pancreas: Unremarkable. No pancreatic ductal dilatation or surrounding  inflammatory changes. Spleen: Normal in size without focal abnormality. Adrenals/Urinary Tract: The adrenal glands are normal bilaterally. A punctate nonobstructing stone at the lower pole of the right kidney is new. 2 mm nonobstructing stone is present at the lower pole of the left kidney. No obstruction is present in either kidney. No solid lesions are present. Ureters are within normal limits bilaterally. The urinary bladder is mostly collapsed. Stomach/Bowel: The stomach and duodenum are within normal limits. The small bowel is unremarkable. Terminal ileum is normal. The ascending and transverse colon are within normal limits. The descending and sigmoid colon are normal. Vascular/Lymphatic: No significant vascular findings are present. No enlarged abdominal or pelvic lymph nodes. Reproductive: Uterus and bilateral adnexa are unremarkable. Musculoskeletal: No acute or significant osseous findings. IMPRESSION: 1. No acute or focal lesion to explain the patient's symptoms. 2. Punctate nonobstructing stone at the lower pole of the right kidney is new. 3. 2 mm nonobstructing stone at the lower pole of the left kidney. 4. No hydronephrosis or acute renal abnormality. 5. Status post cholecystectomy. Electronically Signed   By: Marin Roberts M.D.   On: 03/27/2023 12:51   DG Chest 2 View  Result Date: 03/27/2023 CLINICAL DATA:  sob EXAM: CHEST - 2 VIEW COMPARISON:  March 18, 2023. FINDINGS: The cardiomediastinal silhouette is normal in contour. No pleural effusion. No pneumothorax. No acute pleuroparenchymal abnormality. Visualized abdomen is unremarkable. No acute osseous abnormality noted. IMPRESSION: No acute cardiopulmonary abnormality. Electronically Signed   By: Meda Klinefelter M.D.   On: 03/27/2023 11:35    Procedures Procedures    Medications Ordered in ED Medications  ibuprofen (ADVIL) tablet 600 mg (600 mg Oral Given 03/27/23 1338)    ED Course/ Medical Decision Making/ A&P                                  Medical Decision Making Amount and/or Complexity of Data Reviewed Labs: ordered. Radiology: ordered.   Patient presents to the ED today for multiple complaints.  Chief among these is her feeling that she has undiagnosed diabetes.  She states that she has had a history of gestational diabetes.  She is followed by her PCP and gets every 3 month A1c checks.  Most recently, A1c was 5.4.  She is on Ozempic.  Today,  blood glucose is normal.  There is no evidence of glucosuria.  She has trace ketones in her urine which she believes is caused by diabetes.  It was explained to the patient that all of her lab work today is not consistent with diabetes.  Additionally, her last A1c was normal.  In regards to her intermittent chest pain, she did have a clean coronary CT 4 days ago.  She was also given reassurance about this.  Currently, she endorses pain in her right flank.  A CT stone study was ordered to assess for nephrolithiasis.  Urinalysis does not show evidence of infection.  Patient requested STI testing as well.  She prefers self swab.  This was ordered.  Remaining serum lab work showed normal troponin, normal kidney function, normal electrolytes, no leukocytosis.  Wet prep was negative.  CT scan showed no evidence of obstructive uropathy or any other cause of her right flank pain.  On reassessment, patient comfortably on ED stretcher.  She was advised to follow-up on GC/chlamydia and HIV testing through MyChart or PCP.  She was discharged in good condition.        Final Clinical Impression(s) / ED Diagnoses Final diagnoses:  Right flank pain    Rx / DC Orders ED Discharge Orders     None         Gloris Manchester, MD 03/27/23 1411

## 2023-03-27 NOTE — ED Notes (Signed)
RN notified phlebotomy to collect lab

## 2023-03-29 DIAGNOSIS — R0789 Other chest pain: Secondary | ICD-10-CM | POA: Diagnosis not present

## 2023-03-29 DIAGNOSIS — R002 Palpitations: Secondary | ICD-10-CM | POA: Diagnosis not present

## 2023-03-29 DIAGNOSIS — K219 Gastro-esophageal reflux disease without esophagitis: Secondary | ICD-10-CM | POA: Diagnosis not present

## 2023-03-29 LAB — GC/CHLAMYDIA PROBE AMP (~~LOC~~) NOT AT ARMC
Chlamydia: NEGATIVE
Comment: NEGATIVE
Comment: NORMAL
Neisseria Gonorrhea: NEGATIVE

## 2023-04-02 DIAGNOSIS — E119 Type 2 diabetes mellitus without complications: Secondary | ICD-10-CM | POA: Diagnosis not present

## 2023-04-02 DIAGNOSIS — E162 Hypoglycemia, unspecified: Secondary | ICD-10-CM | POA: Diagnosis not present

## 2023-04-05 ENCOUNTER — Encounter (HOSPITAL_COMMUNITY): Payer: Self-pay | Admitting: Emergency Medicine

## 2023-04-05 ENCOUNTER — Other Ambulatory Visit: Payer: Self-pay

## 2023-04-05 ENCOUNTER — Emergency Department (HOSPITAL_COMMUNITY)
Admission: EM | Admit: 2023-04-05 | Discharge: 2023-04-05 | Discharge status: Against Medical Advice | Payer: Medicaid Other | Attending: Emergency Medicine | Admitting: Emergency Medicine

## 2023-04-05 DIAGNOSIS — Z5321 Procedure and treatment not carried out due to patient leaving prior to being seen by health care provider: Secondary | ICD-10-CM | POA: Insufficient documentation

## 2023-04-05 DIAGNOSIS — E162 Hypoglycemia, unspecified: Secondary | ICD-10-CM | POA: Diagnosis not present

## 2023-04-05 LAB — COMPREHENSIVE METABOLIC PANEL
ALT: 28 U/L (ref 0–44)
AST: 18 U/L (ref 15–41)
Albumin: 4.2 g/dL (ref 3.5–5.0)
Alkaline Phosphatase: 63 U/L (ref 38–126)
Anion gap: 6 (ref 5–15)
BUN: 10 mg/dL (ref 6–20)
CO2: 27 mmol/L (ref 22–32)
Calcium: 8.9 mg/dL (ref 8.9–10.3)
Chloride: 104 mmol/L (ref 98–111)
Creatinine, Ser: 0.92 mg/dL (ref 0.44–1.00)
GFR, Estimated: >60 mL/min (ref >60)
Glucose, Bld: 85 mg/dL (ref 70–99)
Potassium: 3.4 mmol/L — ABNORMAL LOW (ref 3.5–5.1)
Sodium: 137 mmol/L (ref 135–145)
Total Bilirubin: 1.2 mg/dL (ref 0.3–1.2)
Total Protein: 7.2 g/dL (ref 6.5–8.1)

## 2023-04-05 LAB — CBC WITH DIFFERENTIAL/PLATELET
Abs Immature Granulocytes: 0.02 10*3/uL (ref 0.00–0.07)
Basophils Absolute: 0 10*3/uL (ref 0.0–0.1)
Basophils Relative: 1 %
Eosinophils Absolute: 0.1 10*3/uL (ref 0.0–0.5)
Eosinophils Relative: 2 %
HCT: 44 % (ref 36.0–46.0)
Hemoglobin: 14.6 g/dL (ref 12.0–15.0)
Immature Granulocytes: 0 %
Lymphocytes Relative: 25 %
Lymphs Abs: 1.6 10*3/uL (ref 0.7–4.0)
MCH: 29 pg (ref 26.0–34.0)
MCHC: 33.2 g/dL (ref 30.0–36.0)
MCV: 87.5 fL (ref 80.0–100.0)
Monocytes Absolute: 0.4 10*3/uL (ref 0.1–1.0)
Monocytes Relative: 5 %
Neutro Abs: 4.5 10*3/uL (ref 1.7–7.7)
Neutrophils Relative %: 67 %
Platelets: 246 10*3/uL (ref 150–400)
RBC: 5.03 MIL/uL (ref 3.87–5.11)
RDW: 13.1 % (ref 11.5–15.5)
WBC: 6.6 10*3/uL (ref 4.0–10.5)
nRBC: 0 % (ref 0.0–0.2)

## 2023-04-05 LAB — CBG MONITORING, ED: Glucose-Capillary: 98 mg/dL (ref 70–99)

## 2023-04-05 NOTE — ED Triage Notes (Signed)
Pt presents with low blood sugar readings, per monitor it was 68 at home, at something and blood sugar monitor reading 103.

## 2023-04-07 DIAGNOSIS — Z79899 Other long term (current) drug therapy: Secondary | ICD-10-CM | POA: Diagnosis not present

## 2023-04-07 DIAGNOSIS — E11649 Type 2 diabetes mellitus with hypoglycemia without coma: Secondary | ICD-10-CM | POA: Diagnosis not present

## 2023-04-07 DIAGNOSIS — E162 Hypoglycemia, unspecified: Secondary | ICD-10-CM | POA: Diagnosis not present

## 2023-04-07 DIAGNOSIS — Z7985 Long-term (current) use of injectable non-insulin antidiabetic drugs: Secondary | ICD-10-CM | POA: Diagnosis not present

## 2023-04-07 DIAGNOSIS — Z91041 Radiographic dye allergy status: Secondary | ICD-10-CM | POA: Diagnosis not present

## 2023-04-09 DIAGNOSIS — E162 Hypoglycemia, unspecified: Secondary | ICD-10-CM | POA: Diagnosis not present

## 2023-04-11 ENCOUNTER — Ambulatory Visit
Admission: EM | Admit: 2023-04-11 | Discharge: 2023-04-11 | Disposition: A | Discharge status: Home / Self Care | Payer: Medicaid Other | Attending: Internal Medicine | Admitting: Internal Medicine

## 2023-04-11 DIAGNOSIS — B379 Candidiasis, unspecified: Secondary | ICD-10-CM | POA: Diagnosis not present

## 2023-04-11 DIAGNOSIS — L03316 Cellulitis of umbilicus: Secondary | ICD-10-CM | POA: Diagnosis not present

## 2023-04-11 DIAGNOSIS — T3695XA Adverse effect of unspecified systemic antibiotic, initial encounter: Secondary | ICD-10-CM

## 2023-04-11 MED ORDER — FLUCONAZOLE 150 MG PO TABS: 150.0000 mg | ORAL_TABLET | ORAL | 0 refills | Status: DC

## 2023-04-11 MED ORDER — DOXYCYCLINE HYCLATE 100 MG PO CAPS: 100.0000 mg | ORAL_CAPSULE | Freq: Two times a day (BID) | ORAL | 0 refills | Status: DC
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
## 2023-04-11 NOTE — ED Triage Notes (Signed)
Pt reports her belly button has been painful, redness, foul odor, odor, and fever x 3 days    Cleaned with peroxiode and took tylenol

## 2023-04-11 NOTE — Discharge Instructions (Signed)
You have an infection of the top layer of the skin of your belly button called cellulitis. Please take doxycycline antibiotic twice a day for the next 7 days to treat the infection. Apply warm compresses to the area to help get more blood flow to the belly button so that your body can heal the infection faster. Watch for spreading redness, worsening pain, worsening pus/drainage, or worsening odor/fever while on antibiotic. If these symptoms should happen, please return!  If symptoms are severe, please go to the ER.  Feel better!

## 2023-04-13 ENCOUNTER — Ambulatory Visit
Admission: EM | Admit: 2023-04-13 | Discharge: 2023-04-13 | Disposition: A | Discharge status: Home / Self Care | Payer: Medicaid Other | Attending: Nurse Practitioner | Admitting: Nurse Practitioner

## 2023-04-13 DIAGNOSIS — L03316 Cellulitis of umbilicus: Secondary | ICD-10-CM

## 2023-04-13 MED ORDER — SULFAMETHOXAZOLE-TRIMETHOPRIM 800-160 MG PO TABS
1.0000 | ORAL_TABLET | Freq: Two times a day (BID) | ORAL | 0 refills | Status: AC
Start: 2023-04-13 — End: 2023-04-20

## 2023-04-13 NOTE — Discharge Instructions (Addendum)
Stop taking the doxycycline and take the Bactrim as prescribed.  Continue to keep the area clean and dry.  If you start having a fever, bodyaches or chills, or worsening drainage from the area, please seek care in the emergency room.

## 2023-04-13 NOTE — ED Triage Notes (Signed)
Pt here for follow up on cellulitis, pt states has had a boil pop up on the side of the of the cellulitis, feels like antibiotics may be causing her dizziness confusion brain fog and abdominal pain
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
## 2023-04-14 DIAGNOSIS — R4189 Other symptoms and signs involving cognitive functions and awareness: Secondary | ICD-10-CM | POA: Diagnosis not present

## 2023-04-15 ENCOUNTER — Ambulatory Visit (HOSPITAL_COMMUNITY): Admission: RE | Admit: 2023-04-15 | Payer: Medicaid Other | Source: Ambulatory Visit

## 2023-04-20 DIAGNOSIS — N200 Calculus of kidney: Secondary | ICD-10-CM | POA: Diagnosis not present

## 2023-04-20 DIAGNOSIS — E162 Hypoglycemia, unspecified: Secondary | ICD-10-CM | POA: Diagnosis not present

## 2023-04-20 DIAGNOSIS — E538 Deficiency of other specified B group vitamins: Secondary | ICD-10-CM | POA: Diagnosis not present

## 2023-04-20 DIAGNOSIS — E119 Type 2 diabetes mellitus without complications: Secondary | ICD-10-CM | POA: Diagnosis not present

## 2023-04-22 DIAGNOSIS — R002 Palpitations: Secondary | ICD-10-CM | POA: Diagnosis not present

## 2023-04-23 ENCOUNTER — Encounter: Payer: Medicaid Other | Admitting: Urology

## 2023-04-23 DIAGNOSIS — R569 Unspecified convulsions: Secondary | ICD-10-CM | POA: Diagnosis not present

## 2023-04-23 DIAGNOSIS — R4189 Other symptoms and signs involving cognitive functions and awareness: Secondary | ICD-10-CM | POA: Diagnosis not present

## 2023-04-24 DIAGNOSIS — R569 Unspecified convulsions: Secondary | ICD-10-CM | POA: Diagnosis not present

## 2023-04-24 DIAGNOSIS — R4189 Other symptoms and signs involving cognitive functions and awareness: Secondary | ICD-10-CM | POA: Diagnosis not present

## 2023-04-25 DIAGNOSIS — R4189 Other symptoms and signs involving cognitive functions and awareness: Secondary | ICD-10-CM | POA: Diagnosis not present

## 2023-04-25 DIAGNOSIS — R569 Unspecified convulsions: Secondary | ICD-10-CM | POA: Diagnosis not present

## 2023-04-27 ENCOUNTER — Ambulatory Visit: Payer: Medicaid Other | Admitting: Physician Assistant

## 2023-05-13 DIAGNOSIS — F331 Major depressive disorder, recurrent, moderate: Secondary | ICD-10-CM | POA: Diagnosis not present

## 2023-05-14 ENCOUNTER — Encounter: Payer: Medicaid Other | Admitting: Urology

## 2023-05-21 ENCOUNTER — Ambulatory Visit (HOSPITAL_COMMUNITY): Admission: RE | Admit: 2023-05-21 | Payer: Medicaid Other | Source: Ambulatory Visit

## 2023-05-27 ENCOUNTER — Ambulatory Visit (HOSPITAL_COMMUNITY): Payer: Medicaid Other

## 2023-06-18 ENCOUNTER — Encounter: Payer: Medicaid Other | Admitting: Urology

## 2023-06-18 ENCOUNTER — Other Ambulatory Visit: Payer: Self-pay

## 2023-06-18 DIAGNOSIS — N200 Calculus of kidney: Secondary | ICD-10-CM

## 2023-08-12 DIAGNOSIS — F4312 Post-traumatic stress disorder, chronic: Secondary | ICD-10-CM | POA: Diagnosis not present

## 2023-08-23 DIAGNOSIS — Z7985 Long-term (current) use of injectable non-insulin antidiabetic drugs: Secondary | ICD-10-CM | POA: Diagnosis not present

## 2023-08-23 DIAGNOSIS — F909 Attention-deficit hyperactivity disorder, unspecified type: Secondary | ICD-10-CM | POA: Diagnosis not present

## 2023-08-23 DIAGNOSIS — R5383 Other fatigue: Secondary | ICD-10-CM | POA: Diagnosis not present

## 2023-08-23 DIAGNOSIS — R7989 Other specified abnormal findings of blood chemistry: Secondary | ICD-10-CM | POA: Diagnosis not present

## 2023-08-23 DIAGNOSIS — Z79899 Other long term (current) drug therapy: Secondary | ICD-10-CM | POA: Diagnosis not present

## 2023-08-23 DIAGNOSIS — R6882 Decreased libido: Secondary | ICD-10-CM | POA: Diagnosis not present

## 2023-08-23 DIAGNOSIS — Z87891 Personal history of nicotine dependence: Secondary | ICD-10-CM | POA: Diagnosis not present

## 2023-08-23 DIAGNOSIS — E119 Type 2 diabetes mellitus without complications: Secondary | ICD-10-CM | POA: Diagnosis not present

## 2023-08-23 DIAGNOSIS — E538 Deficiency of other specified B group vitamins: Secondary | ICD-10-CM | POA: Diagnosis not present

## 2024-02-03 DIAGNOSIS — F331 Major depressive disorder, recurrent, moderate: Secondary | ICD-10-CM | POA: Diagnosis not present

## 2024-02-03 DIAGNOSIS — Z1330 Encounter for screening examination for mental health and behavioral disorders, unspecified: Secondary | ICD-10-CM | POA: Diagnosis not present

## 2024-02-03 DIAGNOSIS — Z1331 Encounter for screening for depression: Secondary | ICD-10-CM | POA: Diagnosis not present

## 2024-02-03 DIAGNOSIS — F4312 Post-traumatic stress disorder, chronic: Secondary | ICD-10-CM | POA: Diagnosis not present

## 2024-02-03 DIAGNOSIS — F909 Attention-deficit hyperactivity disorder, unspecified type: Secondary | ICD-10-CM | POA: Diagnosis not present

## 2024-04-25 DIAGNOSIS — Z7985 Long-term (current) use of injectable non-insulin antidiabetic drugs: Secondary | ICD-10-CM | POA: Diagnosis not present

## 2024-04-25 DIAGNOSIS — E538 Deficiency of other specified B group vitamins: Secondary | ICD-10-CM | POA: Diagnosis not present

## 2024-04-25 DIAGNOSIS — R635 Abnormal weight gain: Secondary | ICD-10-CM | POA: Diagnosis not present

## 2024-04-25 DIAGNOSIS — R221 Localized swelling, mass and lump, neck: Secondary | ICD-10-CM | POA: Diagnosis not present

## 2024-04-25 DIAGNOSIS — E119 Type 2 diabetes mellitus without complications: Secondary | ICD-10-CM | POA: Diagnosis not present

## 2024-04-25 DIAGNOSIS — E049 Nontoxic goiter, unspecified: Secondary | ICD-10-CM | POA: Diagnosis not present

## 2024-04-25 DIAGNOSIS — R400 Somnolence: Secondary | ICD-10-CM | POA: Diagnosis not present

## 2024-05-10 DIAGNOSIS — Z1330 Encounter for screening examination for mental health and behavioral disorders, unspecified: Secondary | ICD-10-CM | POA: Diagnosis not present

## 2024-05-10 DIAGNOSIS — F909 Attention-deficit hyperactivity disorder, unspecified type: Secondary | ICD-10-CM | POA: Diagnosis not present

## 2024-05-10 DIAGNOSIS — Z1331 Encounter for screening for depression: Secondary | ICD-10-CM | POA: Diagnosis not present

## 2024-05-10 DIAGNOSIS — F4312 Post-traumatic stress disorder, chronic: Secondary | ICD-10-CM | POA: Diagnosis not present

## 2024-05-10 DIAGNOSIS — F331 Major depressive disorder, recurrent, moderate: Secondary | ICD-10-CM | POA: Diagnosis not present

## 2024-05-25 DIAGNOSIS — R590 Localized enlarged lymph nodes: Secondary | ICD-10-CM | POA: Diagnosis not present

## 2024-05-25 DIAGNOSIS — E049 Nontoxic goiter, unspecified: Secondary | ICD-10-CM | POA: Diagnosis not present

## 2024-06-06 ENCOUNTER — Ambulatory Visit: Admission: RE | Admit: 2024-06-06 | Discharge: 2024-06-06 | Disposition: A | Discharge status: Home / Self Care

## 2024-06-06 VITALS — BP 121/86 | HR 98 | Temp 98.4°F | Resp 18

## 2024-06-06 DIAGNOSIS — J029 Acute pharyngitis, unspecified: Secondary | ICD-10-CM

## 2024-06-06 DIAGNOSIS — J069 Acute upper respiratory infection, unspecified: Secondary | ICD-10-CM | POA: Diagnosis not present

## 2024-06-06 DIAGNOSIS — Z760 Encounter for issue of repeat prescription: Secondary | ICD-10-CM | POA: Diagnosis not present

## 2024-06-06 LAB — POC COVID19/FLU A&B COMBO
Covid Antigen, POC: NEGATIVE
Influenza A Antigen, POC: NEGATIVE
Influenza B Antigen, POC: NEGATIVE

## 2024-06-06 LAB — POCT RAPID STREP A (OFFICE): Rapid Strep A Screen: NEGATIVE

## 2024-06-06 MED ORDER — BENZONATATE 100 MG PO CAPS
100.0000 mg | ORAL_CAPSULE | Freq: Three times a day (TID) | ORAL | 0 refills | Status: AC | PRN
Start: 2024-06-06 — End: ?

## 2024-06-06 MED ORDER — ALBUTEROL SULFATE HFA 108 (90 BASE) MCG/ACT IN AERS
1.0000 | INHALATION_SPRAY | Freq: Four times a day (QID) | RESPIRATORY_TRACT | 0 refills | Status: AC | PRN
Start: 2024-06-06 — End: ?
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
## 2024-06-06 NOTE — Discharge Instructions (Signed)
 You have a viral upper respiratory infection.  Symptoms should improve over the next week to 10 days.  If you develop chest pain or shortness of breath, go to the emergency room.  COVID-19, influenza, and strep throats test today are negative.   Some things that can make you feel better are: - Increased rest - Increasing fluid with water/sugar free electrolytes - Acetaminophen  and ibuprofen  as needed for fever/pain - Salt water gargling, chloraseptic spray and throat lozenges - OTC guaifenesin (Mucinex) 600 mg twice daily for congestion - Saline sinus flushes or a neti pot - Humidifying the air -Tessalon  Perles every 8 hours as needed for dry cough

## 2024-06-06 NOTE — ED Triage Notes (Signed)
 productive cough and feels SOB x 2 days.  States sore throat started this morning.
